# Patient Record
Sex: Female | Born: 1995 | Race: Black or African American | Hispanic: No | Marital: Single | Smoking: Never smoker
Health system: Southern US, Community
[De-identification: ages and names within clinical notes are randomized; demographics above are authoritative.]

## PROBLEM LIST (undated history)

## (undated) DIAGNOSIS — B353 Tinea pedis: Secondary | ICD-10-CM

## (undated) DIAGNOSIS — L905 Scar conditions and fibrosis of skin: Secondary | ICD-10-CM

## (undated) DIAGNOSIS — N644 Mastodynia: Secondary | ICD-10-CM

## (undated) DIAGNOSIS — R87612 Low grade squamous intraepithelial lesion on cytologic smear of cervix (LGSIL): Secondary | ICD-10-CM

## (undated) DIAGNOSIS — H547 Unspecified visual loss: Secondary | ICD-10-CM

## (undated) DIAGNOSIS — Z8619 Personal history of other infectious and parasitic diseases: Secondary | ICD-10-CM

## (undated) DIAGNOSIS — L708 Other acne: Secondary | ICD-10-CM

## (undated) HISTORY — DX: Personal history of other infectious and parasitic diseases: Z86.19

## (undated) HISTORY — DX: Low grade squamous intraepithelial lesion on cytologic smear of cervix (LGSIL): R87.612

---
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
## 2013-01-18 IMAGING — CR DG FINGER MIDDLE 2+V*R*
3 series · 3 of 3 positions shown · non-contrast
Comparison: None.

CLINICAL DATA: Pain and swelling to the third digit of the right
hand.  No trauma.

RIGHT MIDDLE FINGER 2+V

[x finger pa right]
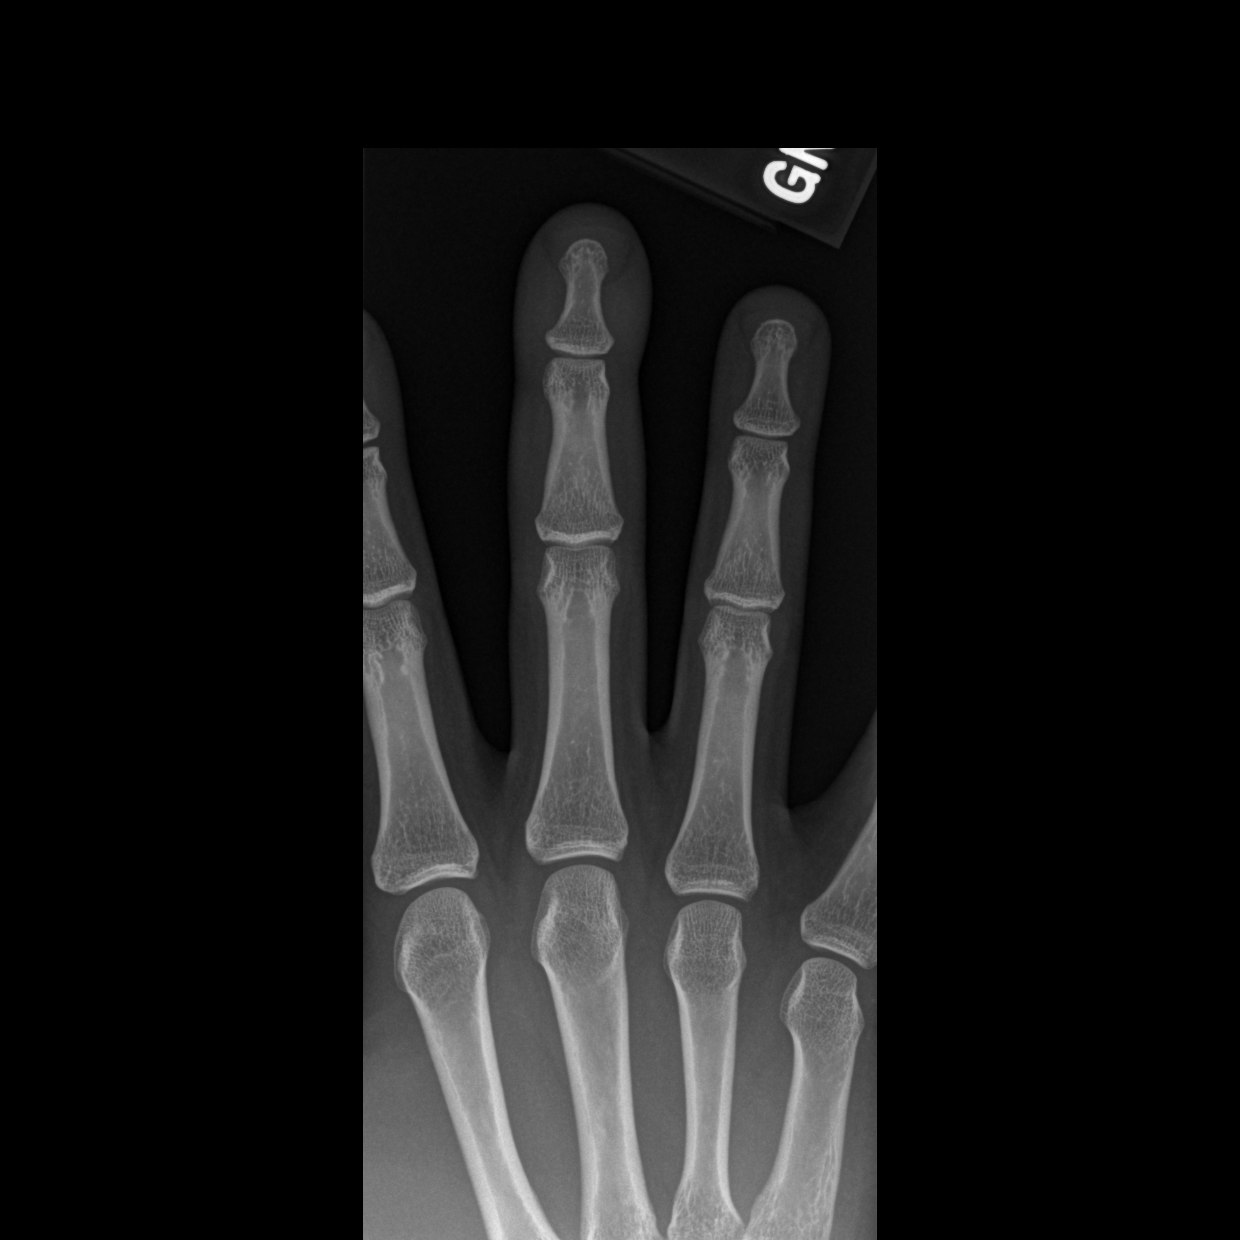

[x finger obl. right]
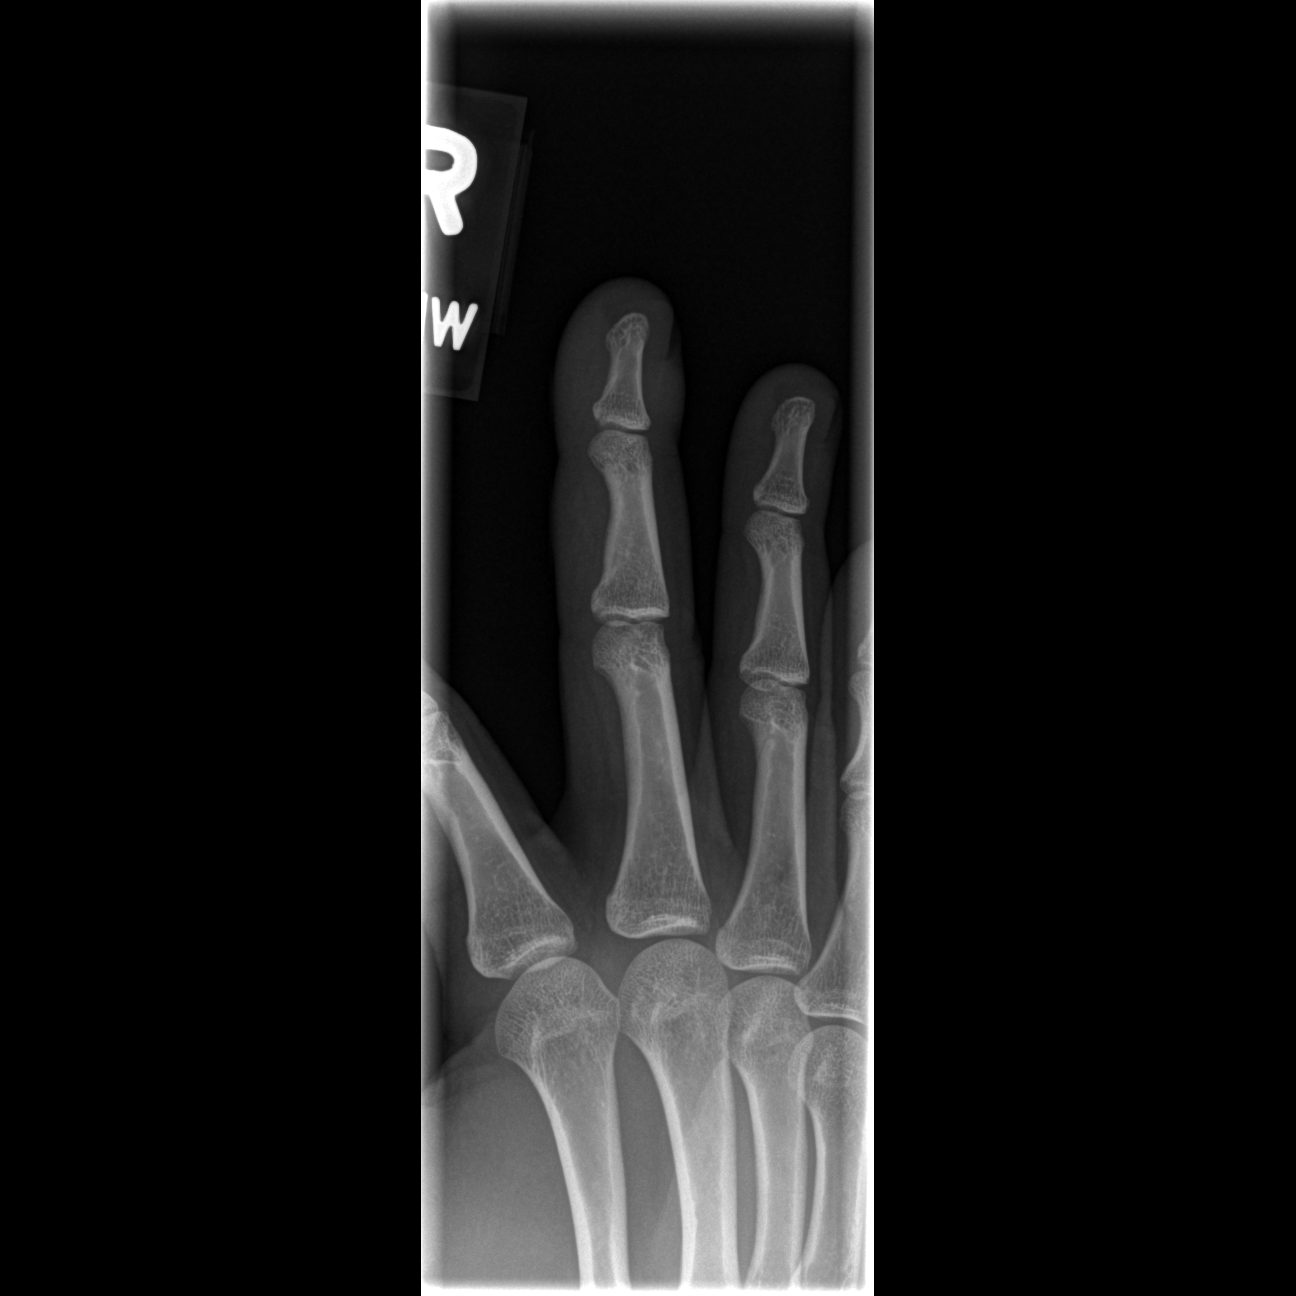

[x finger lateral right]
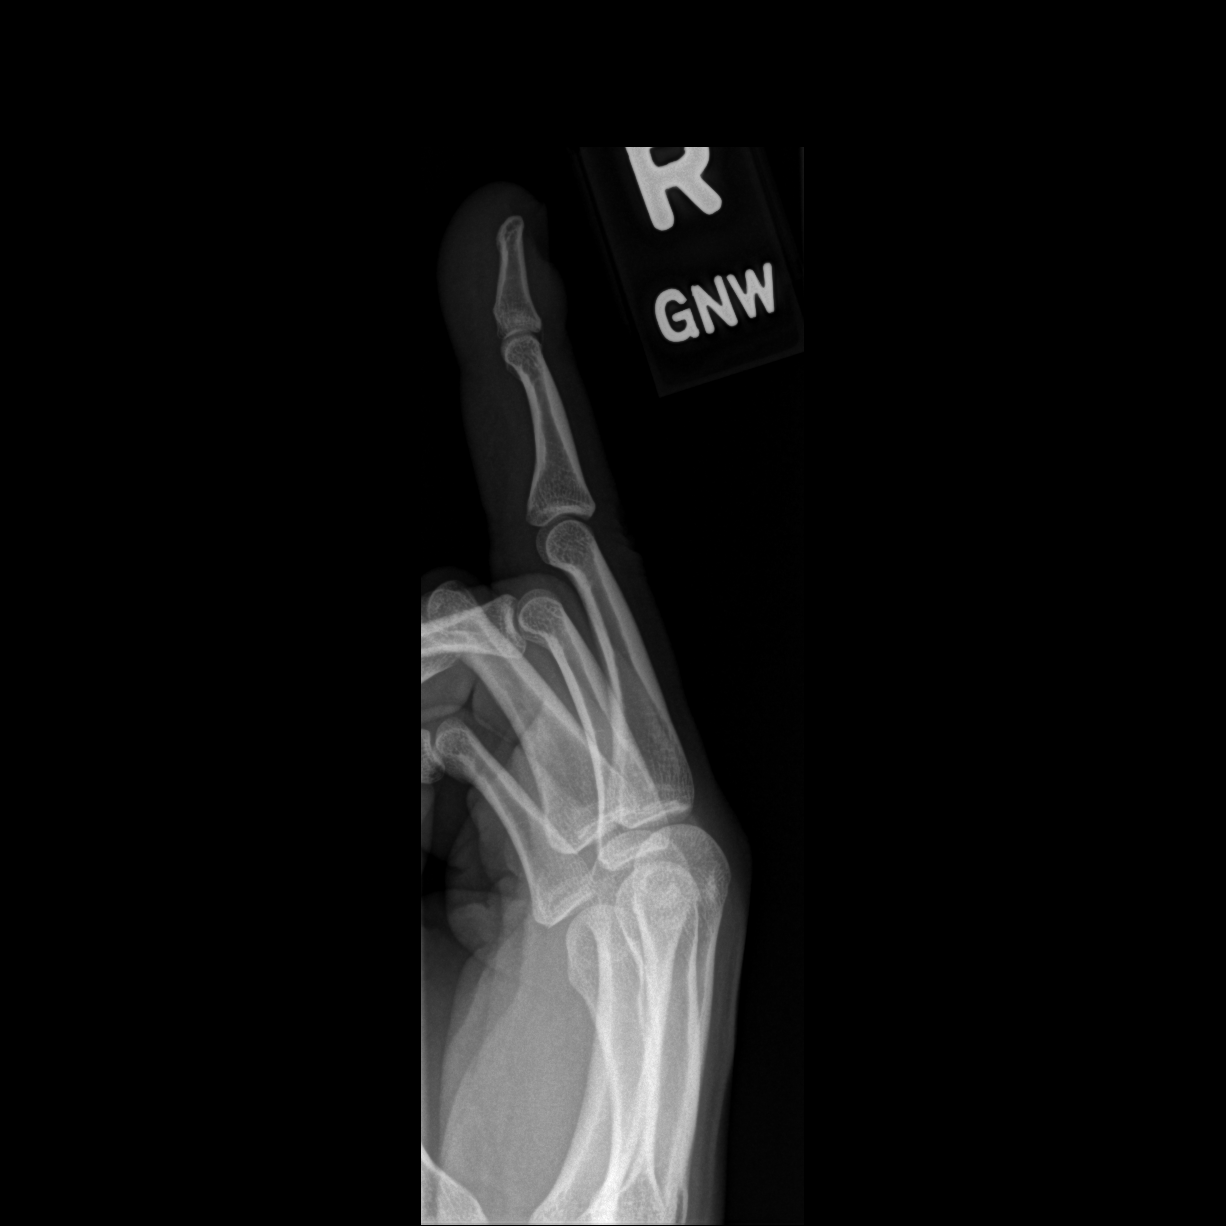

[3 of 3 positions shown; findings below may reference images not displayed]

FINDINGS: Soft tissue swelling over the distal aspect of the right
third finger.  Bones appear intact.  No evidence of acute fracture
or subluxation.  No focal bone lesion or bone destruction.  Bone
cortex and trabecular architecture appear intact.  No abnormal
periosteal reaction.  No radiopaque foreign bodies.
IMPRESSION: Distal soft tissue swelling over the right third finger.  No acute
bony abnormalities.  No soft tissue foreign bodies demonstrated.

## 2013-02-04 ENCOUNTER — Encounter: Payer: Self-pay | Admitting: Family Medicine

## 2013-02-04 ENCOUNTER — Ambulatory Visit (INDEPENDENT_AMBULATORY_CARE_PROVIDER_SITE_OTHER): Payer: Medicaid Other | Admitting: Family Medicine

## 2013-02-04 VITALS — BP 107/58 | HR 72 | Ht 65.5 in | Wt 156.3 lb

## 2013-02-04 DIAGNOSIS — J45990 Exercise induced bronchospasm: Secondary | ICD-10-CM

## 2013-02-04 DIAGNOSIS — Z00129 Encounter for routine child health examination without abnormal findings: Secondary | ICD-10-CM

## 2013-02-04 MED ORDER — ALBUTEROL SULFATE HFA 108 (90 BASE) MCG/ACT IN AERS: INHALATION_SPRAY | RESPIRATORY_TRACT | Status: DC

## 2013-02-04 NOTE — Progress Notes (Signed)
  Subjective:     History was provided by the mother.  Kathleen Alexander is a 17 y.o. female who is here for this wellness visit.   Current Issues: Current concerns include: Increased shortness of breath, especially with exercise activities.  Has noticed mild wheezing. Does not notice at other times.  Does not wake with cough or shortness of breath.   H (Home) Family Relationships: good Communication: good with parents Responsibilities: has responsibilities at home  E (Education): Grades: Bs and Cs School: good attendance Future Plans: college and unsure  A (Activities) Sports: no sports Exercise: Yes  and dance Activities: dance Friends: Yes   A (Auton/Safety) Auto: wears seat belt Bike: does not ride Safety: cannot swim  D (Diet) Diet: poor diet habits Risky eating habits: none Intake: no vegetables Body Image: positive body image  Drugs Tobacco: No Alcohol: No Drugs: No  Sex Activity: abstinent  Suicide Risk Emotions: healthy Depression: denies feelings of depression Suicidal: denies suicidal ideation     Objective:     Filed Vitals:   02/04/13 1046  BP: 107/58  Pulse: 72  Height: 5' 5.5" (1.664 m)  Weight: 156 lb 4.8 oz (70.897 kg)   Growth parameters are noted and are appropriate for age.  General:   alert and no distress  Gait:   normal  Skin:   normal  Oral cavity:   lips, mucosa, and tongue normal; teeth and gums normal  Eyes:   sclerae white, pupils equal and reactive, red reflex normal bilaterally  Ears:   normal bilaterally  Neck:   normal  Lungs:  clear to auscultation bilaterally  Heart:   regular rate and rhythm, S1, S2 normal, no murmur, click, rub or gallop  Abdomen:  soft, non-tender; bowel sounds normal; no masses,  no organomegaly  GU:  not examined  Extremities:   extremities normal, atraumatic, no cyanosis or edema  Neuro:  normal without focal findings, mental status, speech normal, alert and oriented x3, PERLA and  reflexes normal and symmetric     Assessment:    Healthy 17 y.o. female child.    Plan:   1. Anticipatory guidance discussed. Nutrition, Physical activity, Behavior, Emergency Care, Sick Care, Safety and Handout given Poor dietary habits, advised to incorporat more fruits and vegetables into diet and eat a well balanced diet.   2. Bronchospasm:  Sounds like exercised induced bronchospasm, will send in albuterol for her to try to see if this improves her symptoms.   3. Follow-up visit in 12 months for next wellness visit, or sooner as needed.

## 2013-02-04 NOTE — Patient Instructions (Signed)
Health Maintenance, 18- to 19-Year-Old SCHOOL PERFORMANCE After high school completion, the young adult may be attending college, technical or vocational school, or entering the military or the work force. SOCIAL AND EMOTIONAL DEVELOPMENT The young adult establishes adult relationships and explores sexual identity. Young adults may be living at home or in a college dorm or apartment. Increasing independence is important with young adults. Throughout adolescence, teens should assume responsibility of their own health care. IMMUNIZATIONS Most young adults should be fully vaccinated. A booster dose of Tdap (tetanus, diphtheria, and pertussis, or "whooping cough"), a dose of meningococcal vaccine to protect against a certain type of bacterial meningitis, hepatitis A, human papillomarvirus (HPV), chickenpox, or measles vaccines may be indicated, if not given at an earlier age. Annual influenza or "flu" vaccination should be considered during flu season.   TESTING Annual screening for vision and hearing problems is recommended. Vision should be screened objectively at least once between 18 and 19 years of age. The young adult may be screened for anemia or tuberculosis. Young adults should have a blood test to check for high cholesterol during this time period. Young adults should be screened for use of alcohol and drugs. If the young adult is sexually active, screening for sexually transmitted infections, pregnancy, or HIV may be performed. Screening for cervical cancer should be performed within 3 years of beginning sexual activity. NUTRITION AND ORAL HEALTH  Adequate calcium intake is important. Consume 3 servings of low-fat milk and dairy products daily. For those who do not drink milk or consume dairy products, calcium enriched foods, such as juice, bread, or cereal, dark, leafy greens, or canned fish are alternate sources of calcium.   Drink plenty of water. Limit fruit juice to 8 to 12 ounces per day.  Avoid sugary beverages or sodas.   Discourage skipping meals, especially breakfast. Teens should eat a good variety of vegetables and fruits, as well as lean meats.   Avoid high fat, high salt, and high sugar foods, such as candy, chips, and cookies.   Encourage young adults to participate in meal planning and preparation.   Eat meals together as a family whenever possible. Encourage conversation at mealtime.   Limit fast food choices and eating out at restaurants.   Brush teeth twice a day and floss.   Schedule dental exams twice a year.  SLEEP Regular sleep habits are important. PHYSICAL, SOCIAL, AND EMOTIONAL DEVELOPMENT  One hour of regular physical activity daily is recommended. Continue to participate in sports.   Encourage young adults to develop their own interests and consider community service or volunteerism.   Provide guidance to the young adult in making decisions about college and work plans.   Make sure that young adults know that they should never be in a situation that makes them uncomfortable, and they should tell partners if they do not want to engage in sexual activity.   Talk to the young adult about body image. Eating disorders may be noted at this time. Young adults may also be concerned about being overweight. Monitor the young adult for weight gain or loss.   Mood disturbances, depression, anxiety, alcoholism, or attention problems may be noted in young adults. Talk to the caregiver if there are concerns about mental illness.   Negotiate limit setting and independent decision making.   Encourage the young adult to handle conflict without physical violence.   Avoid loud noises which may impair hearing.   Limit television and computer time to 2 hours per   day. Individuals who engage in excessive sedentary activity are more likely to become overweight.  RISK BEHAVIORS  Sexually active young adults need to take precautions against pregnancy and sexually  transmitted infections. Talk to young adults about contraception.   Provide a tobacco-free and drug-free environment for the young adult. Talk to the young adult about drug, tobacco, and alcohol use among friends or at friends' homes. Make sure the young adult knows that smoking tobacco or marijuana and taking drugs have health consequences and may impact brain development.   Teach the young adult about appropriate use of over-the-counter or prescription medicines.   Establish guidelines for driving and for riding with friends.   Talk to young adults about the risks of drinking and driving or boating. Encourage the young adult to call you if he or she or friends have been drinking or using drugs.   Remind young adults to wear seat belts at all times in cars and life vests in boats.   Young adults should always wear a properly fitted helmet when they are riding a bicycle.   Use caution with all-terrain vehicles (ATVs) or other motorized vehicles.   Do not keep handguns in the home. (If you do, the gun and ammunition should be locked separately and out of the young adult's access.)   Equip your home with smoke detectors and change the batteries regularly. Make sure all family members know the fire escape plans for your home.   Teach young adults not to swim alone and not to dive in shallow water.   All individuals should wear sunscreen that protects against UVA and UVB light with at least a sun protection factor (SPF) of 30 when out in the sun. This minimizes sun burning.  WHAT'S NEXT? Young adults should visit their pediatrician or family physician yearly. By young adulthood, health care should be transitioned to a family physician or internal medicine specialist. Sexually active females may want to begin annual physical exams with a gynecologist. Document Released: 03/09/2007 Document Revised: 03/05/2012 Document Reviewed: 03/29/2007 [employer] Patient Information 2013 [employer].    

## 2013-05-16 ENCOUNTER — Other Ambulatory Visit: Payer: Self-pay | Admitting: Family Medicine

## 2013-08-08 ENCOUNTER — Telehealth: Payer: Self-pay | Admitting: Family Medicine

## 2013-08-08 NOTE — Telephone Encounter (Signed)
Mother called wanting a referral for her daughter glasses. She said we gave a referral over the phone a long time ago but forgot who that was. Kathleen Alexander

## 2013-08-09 NOTE — Telephone Encounter (Signed)
Called pt and asked her who she has seen in the past.  Spoke with Kathleen Alexander and Medicaid might not cover a routine eye exam at this age.  It also does not cover glasses.  Left a message for her to call back and let us know.  Jazmin Hartsell,CMA

## 2013-11-06 ENCOUNTER — Telehealth: Payer: Self-pay

## 2013-11-06 NOTE — Telephone Encounter (Signed)
Patient is needing to know the name of the eye doctor she was referred to earlier this year. Please call patient back.

## 2013-11-07 NOTE — Telephone Encounter (Signed)
Left pt a detailed message on identified VM.  Explained to pt that I was unable to find the name of an eye doctor mentioned in her last few visits.  Saw where this was mentioned over the summer and no referral was done then due to her age.  Advised her that she can see who ever she wants, but medicaid will not cover it unless there is a problem. Isauro Skelley,CMA

## 2013-12-09 ENCOUNTER — Ambulatory Visit: Payer: Medicaid Other

## 2013-12-18 ENCOUNTER — Ambulatory Visit: Payer: Medicaid Other | Admitting: Family Medicine

## 2013-12-21 ENCOUNTER — Other Ambulatory Visit (HOSPITAL_COMMUNITY)
Admission: RE | Admit: 2013-12-21 | Discharge: 2013-12-21 | Disposition: A | Discharge status: Home / Self Care | Payer: Medicaid Other | Source: Ambulatory Visit | Attending: Emergency Medicine | Admitting: Emergency Medicine

## 2013-12-21 ENCOUNTER — Encounter (HOSPITAL_COMMUNITY): Payer: Self-pay | Admitting: Emergency Medicine

## 2013-12-21 ENCOUNTER — Emergency Department (INDEPENDENT_AMBULATORY_CARE_PROVIDER_SITE_OTHER)
Admission: EM | Admit: 2013-12-21 | Discharge: 2013-12-21 | Disposition: A | Discharge status: Home / Self Care | Payer: Medicaid Other | Source: Home / Self Care | Attending: Emergency Medicine | Admitting: Emergency Medicine

## 2013-12-21 DIAGNOSIS — L309 Dermatitis, unspecified: Secondary | ICD-10-CM

## 2013-12-21 DIAGNOSIS — J45909 Unspecified asthma, uncomplicated: Secondary | ICD-10-CM

## 2013-12-21 DIAGNOSIS — L259 Unspecified contact dermatitis, unspecified cause: Secondary | ICD-10-CM

## 2013-12-21 DIAGNOSIS — N760 Acute vaginitis: Secondary | ICD-10-CM

## 2013-12-21 DIAGNOSIS — B9689 Other specified bacterial agents as the cause of diseases classified elsewhere: Secondary | ICD-10-CM

## 2013-12-21 DIAGNOSIS — Z113 Encounter for screening for infections with a predominantly sexual mode of transmission: Secondary | ICD-10-CM | POA: Insufficient documentation

## 2013-12-21 DIAGNOSIS — A499 Bacterial infection, unspecified: Secondary | ICD-10-CM

## 2013-12-21 LAB — POCT URINALYSIS DIP (DEVICE)
Bilirubin Urine: NEGATIVE
Hgb urine dipstick: NEGATIVE
Nitrite: NEGATIVE
pH: 6 (ref 5.0–8.0)

## 2013-12-21 MED ORDER — METRONIDAZOLE 500 MG PO TABS: 500.0000 mg | ORAL_TABLET | Freq: Two times a day (BID) | ORAL | Status: DC

## 2013-12-21 MED ORDER — BECLOMETHASONE DIPROPIONATE 80 MCG/ACT IN AERS: 2.0000 | INHALATION_SPRAY | Freq: Two times a day (BID) | RESPIRATORY_TRACT | Status: DC

## 2013-12-21 MED ORDER — TRIAMCINOLONE ACETONIDE 0.1 % EX CREA: 1.0000 "application " | TOPICAL_CREAM | Freq: Three times a day (TID) | CUTANEOUS | Status: DC

## 2013-12-21 MED ORDER — ALBUTEROL SULFATE HFA 108 (90 BASE) MCG/ACT IN AERS: 2.0000 | INHALATION_SPRAY | RESPIRATORY_TRACT | Status: DC | PRN

## 2013-12-21 NOTE — ED Provider Notes (Signed)
Chief Complaint:   Chief Complaint  Patient presents with  . Vaginal Discharge    History of Present Illness:   Kathleen Alexander is a 17 year old female who presents today for vaginal discharge, skin rash, and breathing problems.  1. Vaginal discharge: This is been going on for about a month. The discharge is white and malodorous. She denies any vaginal itching or pain. She does have mild lower abdominal pain rated at 2/10 in intensity. The patient's last menstrual period was December 3. The patient denies sexual activity in the past several years. Her menses have been regular.  2. Rash: She has a pruritic rash on her neck and behind her right ear for the past 5 months. She has not been putting anything on it. She does not recall any specific exposure to antigens or allergens. She denies any history of eczema.  3. Breathing problems: This is been going on for about a year. She sometimes feels tightness in her chest and difficulty breathing. This comes and goes, and is worse after she runs or if she smokes a cigarette. She was given an albuterol inhaler by her primary care physician, and this helped for a while, but its effectiveness seems to be waning. She uses the inhaler almost every day, although she states she's been out of it for a month and has been able to get by without it all. She has no history of asthma, and has never had pulmonary function testing.  Review of Systems:  Other than noted above, the patient denies any of the following symptoms: Systemic:  No fever, chills, sweats, or weight loss. GI:  No abdominal pain, nausea, anorexia, vomiting, diarrhea, constipation, melena or hematochezia. GU:  No dysuria, frequency, urgency, hematuria, vaginal discharge, itching, or abnormal vaginal bleeding. Skin:  No rash or itching.  PMFSH:  Past medical history, family history, social history, meds, and allergies were reviewed.   Physical Exam:   Vital signs:  BP 113/66  Pulse 81   Temp(Src) 98.7 F (37.1 C) (Oral)  Resp 18  SpO2 100% General:  Alert, oriented and in no distress. Lungs:  Breath sounds clear and equal bilaterally.  No wheezes, rales or rhonchi. Heart:  Regular rhythm.  No gallops or murmers. Abdomen:  Soft, flat and non-distended.  No organomegaly or mass.  No tenderness, guarding or rebound.  Bowel sounds normally active. Pelvic exam:  Normal external genitalia, vaginal and cervical mucosa were normal. There was a small amount of white, malodorous discharge. Cervix appeared normal. No pain on cervical motion. Uterus was anterior normal in size and shape. No adnexal masses or tenderness. DNA probes for gonorrhea, Chlamydia, Trichomonas, Gardnerella, Candida were obtained. Skin:  She has a dry, scaly, eczematous rash on her neck and behind her right ear.  Labs:   Results for orders placed during the hospital encounter of 12/21/13  POCT URINALYSIS DIP (DEVICE)      Result Value Range   Glucose, UA NEGATIVE  NEGATIVE mg/dL   Bilirubin Urine NEGATIVE  NEGATIVE   Ketones, ur NEGATIVE  NEGATIVE mg/dL   Specific Gravity, Urine >=1.030  1.005 - 1.030   Hgb urine dipstick NEGATIVE  NEGATIVE   pH 6.0  5.0 - 8.0   Protein, ur NEGATIVE  NEGATIVE mg/dL   Urobilinogen, UA 2.0 (*) 0.0 - 1.0 mg/dL   Nitrite NEGATIVE  NEGATIVE   Leukocytes, UA NEGATIVE  NEGATIVE  POCT PREGNANCY, URINE      Result Value Range   Preg Test, Ur  NEGATIVE  NEGATIVE    Assessment:  The primary encounter diagnosis was Bacterial vaginosis. Diagnoses of Asthma and Eczema were also pertinent to this visit.  I think she has asthma but needs pulmonary function testing. I have urged her to followup with her primary care Dr. The rash in the neck is most likely eczema, given that she probably has asthma. The vaginal discharge is most likely bacterial vaginosis. Awaiting results of DNA probes.  Plan:   1.  Meds:  The following meds were prescribed:   Discharge Medication List as of 12/21/2013   5:31 PM    START taking these medications   Details  !! albuterol (PROVENTIL HFA;VENTOLIN HFA) 108 (90 BASE) MCG/ACT inhaler Inhale 2 puffs into the lungs every 4 (four) hours as needed for wheezing or shortness of breath., Starting 12/21/2013, Until Discontinued, Normal    beclomethasone (QVAR) 80 MCG/ACT inhaler Inhale 2 puffs into the lungs 2 (two) times daily., Starting 12/21/2013, Until Discontinued, Normal    metroNIDAZOLE (FLAGYL) 500 MG tablet Take 1 tablet (500 mg total) by mouth 2 (two) times daily., Starting 12/21/2013, Until Discontinued, Normal    triamcinolone cream (KENALOG) 0.1 % Apply 1 application topically 3 (three) times daily., Starting 12/21/2013, Until Discontinued, Normal     !! - Potential duplicate medications found. Please discuss with provider.      2.  Patient Education/Counseling:  The patient was given appropriate handouts, self care instructions, and instructed in symptomatic relief.  Instructed to use her controller inhaler twice daily and a regular basis and use her Proventil inhaler as needed. She was sized to followup with her primary care physician.  3.  Follow up:  The patient was told to follow up if no better in 3 to 4 days, if becoming worse in any way, and given some red flag symptoms such as increasing difficulty breathing, worsening pelvic pain or fever which would prompt immediate return.  Follow up with Dr. Tawanna Cooler McDiarmid in 2 weeks.     Reuben Likes, MD 12/21/13 2129

## 2013-12-21 NOTE — ED Notes (Signed)
Multiple complaints: reports vaginal discharge reported as yellow/white discharge, noted for a month, low abdominal pain.  Denies pain with urination.   Reports rash around ears, neck and denies any new hair care products, moderate itching. Denies any diagnosi, reports having had an inhaler in the past and wants another one.  Denies any recent illness

## 2013-12-23 ENCOUNTER — Telehealth (HOSPITAL_COMMUNITY): Payer: Self-pay | Admitting: Emergency Medicine

## 2013-12-23 ENCOUNTER — Telehealth (HOSPITAL_COMMUNITY): Payer: Self-pay | Admitting: *Deleted

## 2013-12-23 MED ORDER — AZITHROMYCIN 500 MG PO TABS: ORAL_TABLET | ORAL | Status: DC

## 2013-12-23 NOTE — ED Notes (Signed)
I called pt.'s number and it was not in service. I called mother's number and mother wanted me to tell her the information. I told her that I had to give it to her daughter because she is 17 yo.  She put her on the phone.  Pt. verified x 2 and given results. Pt. told she needs Zithromax for Chlamydia and to finish all of the Flagyl for bacterial vaginosis.  Pt. told where to pick up her Rx.  Pt. instructed to notify her partner, no sex for 1 week and to practice safe sex. Pt. told she can get HIV testing at the [facility]. STD clinic, by appointment.  Pt. voiced understanding. Vassie Moselle 12/23/2013

## 2013-12-23 NOTE — ED Notes (Signed)
The patient's DNA probe came back positive for Chlamydia and Gardnerella. She was treated with Flagyl. She will need treatment with azithromycin 500 mg, #2 tablets, take both at one time. Prescription will be sent into the [facility] pharmacy on [address]. We will advise the patient to inform her sex partners, and report this to the health department.  Reuben Likes, MD 12/23/13 1336

## 2013-12-25 NOTE — Telephone Encounter (Signed)
Message copied by Reuben Likes on Wed Dec 25, 2013  7:47 AM ------      Message from: Vassie Moselle      Created: Tue Dec 24, 2013 11:19 PM       Pt. notified 12/29, documented.  DHHS form sent 12/30.      12/24/2013            ----- Message -----         From: Reuben Likes, MD         Sent: 12/23/2013   1:36 PM           To: Desiree Lucy York, RN            The patient's DNA probe came back positive for Chlamydia and Gardnerella. She was treated with Flagyl. She will need treatment with azithromycin 500 mg, #2 tablets, take both at one time. Prescription will be sent into the [facility] pharmacy on [address]. We will advise the patient to inform her sex partners, and report this to the health department.            ----- Message -----         From: [facility]         Sent: 12/23/2013  12:35 PM           To: Reuben Likes, MD                         ------

## 2013-12-30 ENCOUNTER — Telehealth: Payer: Self-pay | Admitting: Family Medicine

## 2013-12-30 NOTE — Telephone Encounter (Signed)
Mother called for her daughter and they need a referral for a eye exam. They have an appointment already set up for the end of the month with Dr. Katy Fitch. Can we send the referral to them and also call when it is approved. jw

## 2013-12-31 NOTE — Telephone Encounter (Signed)
LMOVM for Kathleen Alexander to call back.  At her last [facility] in February her vision was 20/20.  Why does she have an appt with an eye MD.  Dr. McDiarmid will need to know the details before making referral.  Kathleen Alexander, Kathleen Alexander

## 2014-01-01 ENCOUNTER — Other Ambulatory Visit (HOSPITAL_COMMUNITY)
Admission: RE | Admit: 2014-01-01 | Discharge: 2014-01-01 | Disposition: A | Discharge status: Home / Self Care | Payer: Medicaid Other | Source: Ambulatory Visit | Attending: Family Medicine | Admitting: Family Medicine

## 2014-01-01 ENCOUNTER — Ambulatory Visit: Payer: Medicaid Other | Admitting: Family Medicine

## 2014-01-01 ENCOUNTER — Encounter (HOSPITAL_COMMUNITY): Payer: Self-pay | Admitting: Emergency Medicine

## 2014-01-01 ENCOUNTER — Emergency Department (INDEPENDENT_AMBULATORY_CARE_PROVIDER_SITE_OTHER)
Admission: EM | Admit: 2014-01-01 | Discharge: 2014-01-01 | Disposition: A | Discharge status: Home / Self Care | Payer: Medicaid Other | Source: Home / Self Care | Attending: Family Medicine | Admitting: Family Medicine

## 2014-01-01 DIAGNOSIS — B373 Candidiasis of vulva and vagina: Secondary | ICD-10-CM

## 2014-01-01 DIAGNOSIS — Z113 Encounter for screening for infections with a predominantly sexual mode of transmission: Secondary | ICD-10-CM | POA: Insufficient documentation

## 2014-01-01 DIAGNOSIS — B3731 Acute candidiasis of vulva and vagina: Secondary | ICD-10-CM

## 2014-01-01 DIAGNOSIS — N760 Acute vaginitis: Secondary | ICD-10-CM | POA: Insufficient documentation

## 2014-01-01 LAB — POCT URINALYSIS DIP (DEVICE)
GLUCOSE, UA: 100 mg/dL — AB
HGB URINE DIPSTICK: NEGATIVE
Ketones, ur: NEGATIVE mg/dL
Nitrite: NEGATIVE
PROTEIN: 30 mg/dL — AB
Specific Gravity, Urine: >=1.03 (ref 1.005–1.030)
UROBILINOGEN UA: 2 mg/dL — AB (ref 0.0–1.0)
pH: 6 (ref 5.0–8.0)

## 2014-01-01 LAB — POCT PREGNANCY, URINE: Preg Test, Ur: NEGATIVE

## 2014-01-01 MED ORDER — FLUCONAZOLE 150 MG PO TABS: 150.0000 mg | ORAL_TABLET | Freq: Once | ORAL | Status: DC

## 2014-01-01 NOTE — Discharge Instructions (Signed)
Thank you for coming in today. Take fluconazole once. You may repeat this in one week as needed. Followup with your primary care Dr. if not getting better. I will call you if any of her lab tests are abnormal and the different treatment.  Candidal Vulvovaginitis Candidal vulvovaginitis is an infection of the vagina and vulva. The vulva is the skin around the opening of the vagina. This may cause itching and discomfort in and around the vagina.  HOME CARE  Only take medicine as told by your doctor.  Do not have sex (intercourse) until the infection is healed or as told by your doctor.  Practice safe sex.  Tell your sex partner about your infection.  Do not douche or use tampons.  Wear cotton underwear. Do not wear tight pants or panty hose.  Eat yogurt. This may help treat and prevent yeast infections. GET HELP RIGHT AWAY IF:   You have a fever.  Your problems get worse during treatment or do not get better in 3 days.  You have discomfort, irritation, or itching in your vagina or vulva area.  You have pain after sex.  You start to get belly (abdominal) pain. MAKE SURE YOU:  Understand these instructions.  Will watch your condition.  Will get help right away if you are not doing well or get worse. Document Released: 03/10/2009 Document Revised: 03/05/2012 Document Reviewed: 03/10/2009 [facility] Patient Information 2014 [place], [place].

## 2014-01-01 NOTE — ED Provider Notes (Signed)
Kathleen Alexander is a 18 y.o. female who presents to Urgent Care today for vaginal itching starting yesterday. Patient has not tried any medications yet.  Patient was seen on December 27 and treated for both bacterial vaginosis and Chlamydia with metronidazole and azithromycin. She felt better until yesterday. She denies abdominal pain nausea vomiting fevers or chills. She feels well otherwise.  Patient declines a speculum exam today wishing for blind swabbing  History reviewed. No pertinent past medical history. History  Substance Use Topics  . Smoking status: Never Smoker   . Smokeless tobacco: Not on file  . Alcohol Use: No   ROS as above Medications reviewed. No current facility-administered medications for this encounter.   Current Outpatient Prescriptions  Medication Sig Dispense Refill  . albuterol (PROVENTIL HFA;VENTOLIN HFA) 108 (90 BASE) MCG/ACT inhaler Inhale 2 puffs into the lungs every 4 (four) hours as needed for wheezing or shortness of breath.  1 Inhaler  3  . beclomethasone (QVAR) 80 MCG/ACT inhaler Inhale 2 puffs into the lungs 2 (two) times daily.  1 Inhaler  3  . fluconazole (DIFLUCAN) 150 MG tablet Take 1 tablet (150 mg total) by mouth once.  1 tablet  3  . PROVENTIL HFA 108 (90 BASE) MCG/ACT inhaler USE 2 PUFFS 1/2 HOUR BEFORE ACTIVITY  6.7 each  0  . triamcinolone cream (KENALOG) 0.1 % Apply 1 application topically 3 (three) times daily.  85.2 g  3    Exam:  BP 100/67  Pulse 89  Temp(Src) 98.4 F (36.9 C) (Oral)  Resp 18  SpO2 100% Gen: Well NAD Exts: Non edematous BL  LE, warm and well perfused.  GYN: Normal appearing external genitalia. No significant discharge. Abdomen is nontender  Results for orders placed during the hospital encounter of 01/01/14 (from the past 24 hour(s))  POCT URINALYSIS DIP (DEVICE)     Status: Abnormal   Collection Time    01/01/14 10:32 AM      Result Value Range   Glucose, UA 100 (*) NEGATIVE mg/dL   Bilirubin Urine SMALL  (*) NEGATIVE   Ketones, ur NEGATIVE  NEGATIVE mg/dL   Specific Gravity, Urine >=1.030  1.005 - 1.030   Hgb urine dipstick NEGATIVE  NEGATIVE   pH 6.0  5.0 - 8.0   Protein, ur 30 (*) NEGATIVE mg/dL   Urobilinogen, UA 2.0 (*) 0.0 - 1.0 mg/dL   Nitrite NEGATIVE  NEGATIVE   Leukocytes, UA SMALL (*) NEGATIVE  POCT PREGNANCY, URINE     Status: None   Collection Time    01/01/14 10:33 AM      Result Value Range   Preg Test, Ur NEGATIVE  NEGATIVE   No results found.  Assessment and Plan: 18 y.o. female with vaginal yeast infection. Cytology pending Treat empirically with fluconazole. Followup with primary care provider.  Discussed warning signs or symptoms. Please see discharge instructions. Patient expresses understanding.    Gregor Hams, MD 01/01/14 [phone]

## 2014-01-01 NOTE — ED Notes (Signed)
C/o she has developed yeast after starting flagyl. Declines to provide UA at this time

## 2014-01-04 ENCOUNTER — Telehealth (HOSPITAL_COMMUNITY): Payer: Self-pay | Admitting: Family Medicine

## 2014-01-04 MED ORDER — DOXYCYCLINE HYCLATE 100 MG PO CAPS: 100.0000 mg | ORAL_CAPSULE | Freq: Two times a day (BID) | ORAL | Status: DC

## 2014-01-04 MED ORDER — METRONIDAZOLE 500 MG PO TABS: 500.0000 mg | ORAL_TABLET | Freq: Two times a day (BID) | ORAL | Status: DC

## 2014-01-04 NOTE — ED Notes (Signed)
Pt still has chlamydia. Failed azithromycin treatment. Patient states that she took the medicine.  Called in Doxy.  Asked pt to call me back.   Gregor Hams, MD 01/04/14 [phone]

## 2014-01-04 NOTE — ED Notes (Signed)
Also called in [place], MD 01/04/14 [phone]

## 2014-01-06 ENCOUNTER — Telehealth: Payer: Self-pay | Admitting: Home Health Services

## 2014-01-06 NOTE — Telephone Encounter (Signed)
Left message for patient to schedule ED follow up appointment with PCP.

## 2014-01-07 NOTE — ED Notes (Addendum)
GC neg., Chlamydia pos., Affirm: Candida and Trich neg., Gardnerella pos.  1/9 Message to Dr. Georgina Snell.  1/10 He did orders for Doxycycline and Flagyl and attempted to call pt. I called today and left a message to call. Call 2. Roselyn Meier 01/07/2014 I called pt. Pt. verified x 2 and given results.  Pt. told she needs Doxycycline for the Chlamydia and Flagyl for the bacterial vaginosis and where to pick up the Rx.'s. Pt. said the pharmacy called her on Sunday and she is already taking the medication. I told that Dr. Georgina Snell said the Zithromax did not completely clear the Chlamydia up and that is why she needs the full week of Doxycycline.  Pt. instructed to no alcohol while taking the Flagyl.   I asked if she had notified her partner and she said yes.  Pt. instructed no sex until she has finished her medication and to practiced safe sex. Pt. told she can get HIV testing at the [facility]. STD clinic, by appointment.  Pt. voiced understanding. 01/08/2014

## 2014-01-13 NOTE — ED Notes (Signed)
DHHS form faxed to the [facility]. Roselyn Meier 01/13/2014

## 2014-02-03 ENCOUNTER — Encounter: Payer: Self-pay | Admitting: Family Medicine

## 2014-02-03 ENCOUNTER — Ambulatory Visit (INDEPENDENT_AMBULATORY_CARE_PROVIDER_SITE_OTHER): Payer: Medicaid Other | Admitting: Family Medicine

## 2014-02-03 ENCOUNTER — Other Ambulatory Visit (HOSPITAL_COMMUNITY)
Admission: RE | Admit: 2014-02-03 | Discharge: 2014-02-03 | Disposition: A | Discharge status: Home / Self Care | Payer: Medicaid Other | Source: Ambulatory Visit | Attending: Family Medicine | Admitting: Family Medicine

## 2014-02-03 VITALS — BP 109/70 | HR 81 | Temp 98.2°F | Ht 65.0 in | Wt 160.0 lb

## 2014-02-03 DIAGNOSIS — N898 Other specified noninflammatory disorders of vagina: Secondary | ICD-10-CM

## 2014-02-03 DIAGNOSIS — Z113 Encounter for screening for infections with a predominantly sexual mode of transmission: Secondary | ICD-10-CM | POA: Insufficient documentation

## 2014-02-03 DIAGNOSIS — N760 Acute vaginitis: Secondary | ICD-10-CM

## 2014-02-03 LAB — POCT WET PREP (WET MOUNT): CLUE CELLS WET PREP WHIFF POC: NEGATIVE

## 2014-02-03 NOTE — Progress Notes (Signed)
Patient ID: Kathleen Alexander    DOB: Sep 07, 1996, 18 y.o.   MRN: [id number] --- Subjective:  Kathleen Alexander is a 18 y.o.female who presents with vaginal discharge. It has been present for two weeks. It is a white thick discharge with a malodorous odor. Otherwise, no burning, itching. No abdominal pain. No nausea, no vomiting.  She was diagnosed and treated for BV and trich in January of this year. Since then, she has had sexual intercourse but reports condom use. Partner was treated.   ROS: see HPI Past Medical History: reviewed and updated medications and allergies. Social History: Tobacco: none  Objective: Filed Vitals:   02/03/14 1545  BP: 109/70  Pulse: 81  Temp: 98.2 F (36.8 C)    Physical Examination:   General appearance - alert, well appearing, and in no distress Pelvic exam: normal external genitalia, vulva, vagina, cervix, uterus and adnexa. No cervical motion tenderness. Thick white discharge present

## 2014-02-03 NOTE — Patient Instructions (Signed)
We will call you with results  

## 2014-02-03 NOTE — Assessment & Plan Note (Addendum)
Wet prep, GC/Chl sent.  Recommended HIV/RPR testing but patient declined.

## 2014-02-06 ENCOUNTER — Telehealth: Payer: Self-pay | Admitting: Family Medicine

## 2014-02-06 DIAGNOSIS — N760 Acute vaginitis: Secondary | ICD-10-CM

## 2014-02-06 MED ORDER — FLUCONAZOLE 150 MG PO TABS: 150.0000 mg | ORAL_TABLET | Freq: Once | ORAL | Status: DC

## 2014-02-06 NOTE — Telephone Encounter (Signed)
Called pt.LMVM to call back. Please see message. Thanks. .Kathleen Alexander  

## 2014-02-06 NOTE — Telephone Encounter (Signed)
Please let patient know that her wet prep and GC/Chl were all normal.  She did have a lot of discharge on exam, suspicious for yeast. Will therefore treat empirically for yeast with fluconazole 150mg  x1.   Thank you!  Liam Graham, PGY-3 Family Medicine Resident

## 2014-02-07 NOTE — Telephone Encounter (Signed)
LMVM.  Lazaro Arms, CMA '

## 2014-02-12 NOTE — Telephone Encounter (Signed)
Attempted to call again.  No answer.  Ilene Witcher, Loralyn [place], [place]

## 2014-02-24 ENCOUNTER — Ambulatory Visit (INDEPENDENT_AMBULATORY_CARE_PROVIDER_SITE_OTHER): Payer: Medicaid Other | Admitting: Family Medicine

## 2014-02-24 VITALS — BP 111/75 | HR 90 | Temp 99.1°F | Wt 152.0 lb

## 2014-02-24 DIAGNOSIS — N760 Acute vaginitis: Secondary | ICD-10-CM

## 2014-02-24 DIAGNOSIS — N898 Other specified noninflammatory disorders of vagina: Secondary | ICD-10-CM

## 2014-02-24 MED ORDER — FLUCONAZOLE 150 MG PO TABS: 150.0000 mg | ORAL_TABLET | Freq: Once | ORAL | Status: DC

## 2014-02-24 NOTE — Patient Instructions (Signed)
Good to see you today!  Thanks for coming in.  Take the diflucan one tablet repeat in 4 days if not all back to normal  If after 2 dose you are still having symptoms then call us

## 2014-02-24 NOTE — Assessment & Plan Note (Signed)
Will have her use the diflucan.  If symptoms do not resolve may need further treatment with flagyl or re-exam

## 2014-02-24 NOTE — Progress Notes (Signed)
   Subjective:    Patient ID: Kathleen Alexander, female    DOB: [birth year], 18 y.o.   MRN: [id number]  HPI  Vaginal symptoms Had vaginal discharge and was treated in UC with antibiotics. Then saw Dr Otis Dials had lab tests - normal and was called in diflucan but did not get.  Still having some discharge and now odor.  Not sexually active.  LMP last month   Review of Systems     Objective:   Physical Exam        Assessment & Plan:

## 2014-03-20 ENCOUNTER — Ambulatory Visit (INDEPENDENT_AMBULATORY_CARE_PROVIDER_SITE_OTHER): Payer: Medicaid Other | Admitting: Family Medicine

## 2014-03-20 ENCOUNTER — Other Ambulatory Visit (HOSPITAL_COMMUNITY)
Admission: RE | Admit: 2014-03-20 | Discharge: 2014-03-20 | Disposition: A | Discharge status: Home / Self Care | Payer: Medicaid Other | Source: Ambulatory Visit | Attending: Family Medicine | Admitting: Family Medicine

## 2014-03-20 ENCOUNTER — Encounter: Payer: Self-pay | Admitting: Family Medicine

## 2014-03-20 VITALS — BP 100/69 | HR 87 | Ht 65.0 in | Wt 155.0 lb

## 2014-03-20 DIAGNOSIS — Z23 Encounter for immunization: Secondary | ICD-10-CM

## 2014-03-20 DIAGNOSIS — N760 Acute vaginitis: Secondary | ICD-10-CM | POA: Insufficient documentation

## 2014-03-20 DIAGNOSIS — Z113 Encounter for screening for infections with a predominantly sexual mode of transmission: Secondary | ICD-10-CM | POA: Insufficient documentation

## 2014-03-20 DIAGNOSIS — N898 Other specified noninflammatory disorders of vagina: Secondary | ICD-10-CM

## 2014-03-20 LAB — POCT WET PREP (WET MOUNT)
CLUE CELLS WET PREP WHIFF POC: POSITIVE
WBC, Wet Prep HPF POC: >20

## 2014-03-20 MED ORDER — METRONIDAZOLE 0.75 % VA GEL: 1.0000 | Freq: Two times a day (BID) | VAGINAL | Status: DC

## 2014-03-20 MED ORDER — METRONIDAZOLE 500 MG PO TABS: 500.0000 mg | ORAL_TABLET | Freq: Two times a day (BID) | ORAL | Status: DC

## 2014-03-20 NOTE — Progress Notes (Signed)
Patient ID: RANDYE TREICHLER    DOB: [birth year], 18 y.o.   MRN: [id number] --- Subjective:  Carey is a 18 y.o.female who presents with odorous discharge. Has been present for several weeks. Discharges brownish yellowish. It is associated with a bad odor. She denies any vaginal itching or burning. No dysuria. She was treated with Diflucan which did not help. Not sexually active since last year.   ROS: see HPI Past Medical History: reviewed and updated medications and allergies. Social History: Tobacco: None  Objective: Filed Vitals:   03/20/14 1700  BP: 100/69  Pulse: 87    Physical Examination:   General appearance - alert, well appearing, and in no distress Pelvic exam: normal external genitalia, vulva, vagina. cervix is erythematous, thin yellowish discharge with odor present, no cervical motion tenderness, uterus and adnexa normal  Wet prep: clue cells present

## 2014-03-20 NOTE — Assessment & Plan Note (Addendum)
Wet prep positive for BV. Treat with metrogel at patient's request. Will also send oral flagyl in case metrogel is too expensive.  Gc/chl pending

## 2014-03-21 ENCOUNTER — Telehealth: Payer: Self-pay | Admitting: Family Medicine

## 2014-03-21 LAB — CERVICOVAGINAL ANCILLARY ONLY
CHLAMYDIA, DNA PROBE: NEGATIVE
Neisseria Gonorrhea: NEGATIVE

## 2014-03-21 NOTE — Telephone Encounter (Signed)
Please let patient know that her GC/Chl results were normal.   Thank you!  Liam Graham, PGY-3 Family Medicine Resident

## 2014-03-21 NOTE — Telephone Encounter (Signed)
Pt notified.  [name], CMA  

## 2014-03-31 ENCOUNTER — Encounter: Payer: Self-pay | Admitting: Family Medicine

## 2014-03-31 ENCOUNTER — Ambulatory Visit (INDEPENDENT_AMBULATORY_CARE_PROVIDER_SITE_OTHER): Payer: Medicaid Other | Admitting: Family Medicine

## 2014-03-31 VITALS — BP 104/69 | HR 70 | Ht 65.0 in | Wt 152.0 lb

## 2014-03-31 DIAGNOSIS — A499 Bacterial infection, unspecified: Secondary | ICD-10-CM

## 2014-03-31 DIAGNOSIS — B9689 Other specified bacterial agents as the cause of diseases classified elsewhere: Secondary | ICD-10-CM

## 2014-03-31 DIAGNOSIS — N760 Acute vaginitis: Secondary | ICD-10-CM

## 2014-03-31 MED ORDER — METRONIDAZOLE 0.75 % VA GEL: 1.0000 | Freq: Two times a day (BID) | VAGINAL | Status: DC

## 2014-03-31 NOTE — Assessment & Plan Note (Signed)
Patient has recurrence of symptoms consistent with recent BV infection.  -Will repeat course of Metrogel, refill provided if symptoms persist

## 2014-03-31 NOTE — Patient Instructions (Signed)
Restart Metronidazole gel, complete 5 day course, may repeat if still having symptoms.

## 2014-03-31 NOTE — Progress Notes (Signed)
   Subjective:    Patient ID: Kathleen Alexander, female    DOB: July 16, 1996, 18 y.o.   MRN: [id number]  HPI 18 y/o female presents for evaluation of vaginal discharge. She was recently seen in office on 03/20/14 and diagnosed with bacterial vaginosis. She has a history of multiple BV infections. She was treated with Metrogel. Her symptoms improved however recurred after completing course. Now having grey discharge and vaginal irritation. No dysuria. No abdominal pain. No fevers/chills. Patient requests refill of Metrogel as she does not tolerate PO Metronidazole.   Social: denies recent intercourse, has been having regular periods   Review of Systems  Constitutional: Negative for fever, chills and fatigue.  Gastrointestinal: Negative for nausea, vomiting and diarrhea.  Genitourinary: Positive for vaginal discharge. Negative for dysuria and pelvic pain.       Objective:   Physical Exam Vitals: reviewed Gen: pleasant AAF, NAD GU: performed with nursing staff present, declines speculum exam, normal external female anatomy, copious grey discharge present, external vulva appears slightly irritated  Wet Mount 3/26 - clue cells, positive whiff, no yeast CG/Chlam culture negative from 3/26      Assessment & Plan:  Please see problem specific assessment and plan.

## 2014-05-27 ENCOUNTER — Ambulatory Visit: Payer: Medicaid Other

## 2014-05-28 ENCOUNTER — Ambulatory Visit: Payer: Medicaid Other | Admitting: Sports Medicine

## 2014-05-29 ENCOUNTER — Other Ambulatory Visit (HOSPITAL_COMMUNITY)
Admission: RE | Admit: 2014-05-29 | Discharge: 2014-05-29 | Disposition: A | Discharge status: Home / Self Care | Payer: Medicaid Other | Source: Ambulatory Visit | Attending: Family Medicine | Admitting: Family Medicine

## 2014-05-29 ENCOUNTER — Encounter: Payer: Self-pay | Admitting: Family Medicine

## 2014-05-29 ENCOUNTER — Telehealth: Payer: Self-pay | Admitting: *Deleted

## 2014-05-29 ENCOUNTER — Ambulatory Visit (INDEPENDENT_AMBULATORY_CARE_PROVIDER_SITE_OTHER): Payer: Medicaid Other | Admitting: Family Medicine

## 2014-05-29 VITALS — BP 106/54 | HR 75 | Temp 98.3°F | Ht 65.0 in | Wt 153.0 lb

## 2014-05-29 DIAGNOSIS — Z113 Encounter for screening for infections with a predominantly sexual mode of transmission: Secondary | ICD-10-CM | POA: Insufficient documentation

## 2014-05-29 DIAGNOSIS — N760 Acute vaginitis: Secondary | ICD-10-CM

## 2014-05-29 DIAGNOSIS — A499 Bacterial infection, unspecified: Secondary | ICD-10-CM

## 2014-05-29 DIAGNOSIS — B9689 Other specified bacterial agents as the cause of diseases classified elsewhere: Secondary | ICD-10-CM

## 2014-05-29 DIAGNOSIS — R300 Dysuria: Secondary | ICD-10-CM

## 2014-05-29 LAB — POCT URINALYSIS DIPSTICK
Bilirubin, UA: NEGATIVE
Glucose, UA: NEGATIVE
Ketones, UA: NEGATIVE
NITRITE UA: NEGATIVE
PH UA: 6.5
Protein, UA: 30
RBC UA: NEGATIVE
Spec Grav, UA: >=1.03
Urobilinogen, UA: 1

## 2014-05-29 LAB — POCT WET PREP (WET MOUNT): Clue Cells Wet Prep Whiff POC: POSITIVE

## 2014-05-29 LAB — POCT UA - MICROSCOPIC ONLY

## 2014-05-29 MED ORDER — CLINDAMYCIN HCL 300 MG PO CAPS: 300.0000 mg | ORAL_CAPSULE | Freq: Two times a day (BID) | ORAL | Status: DC

## 2014-05-29 NOTE — Progress Notes (Signed)
Patient ID: JAELEY WIKER    DOB: 02-Oct-1996, 18 y.o.   MRN: [id number] --- Subjective:  Danila is a 18 y.o.female who presents for evaluation of vaginal discharge.  Was treated for BV on 03/31/14. Since then, she has had recurrence of greyish vaginal discharge associated with vaginal itching and burning. She is sexually active, last intercourse was 2 weeks ago with condoms. No abdominal pain, no N/V.  She had an episode of dysuria 2 days ago but this has since resolved. No increased urinary frequency.   ROS: see HPI Past Medical History: reviewed and updated medications and allergies. Social History: Tobacco: none  Objective: Filed Vitals:   05/29/14 0957  BP: 106/54  Pulse: 75  Temp: 98.3 F (36.8 C)    Physical Examination:   General appearance - alert, well appearing, and in no distress Abdomen: soft, non tender, non distended, present bowel sounds Pelvic exam: normal external genitalia, vulva, vagina, cervix, uterus and adnexa. Discharge: white, greyish disharge

## 2014-05-29 NOTE — Progress Notes (Unsigned)
Pt has not had physical since 01/2013.  [name] will call and have them make appt. Kathleen Alexander

## 2014-05-29 NOTE — Telephone Encounter (Signed)
Left detailed message on patient voicemail,instructing her to schedule an annual exam,this would need to take place in order for the physician to complete forms.[place]

## 2014-05-29 NOTE — Patient Instructions (Signed)
-  I will call you with the results

## 2014-05-29 NOTE — Assessment & Plan Note (Signed)
UA unremarkable. Concentrated urine which could explain her symptoms of dysuria.  Increase water intake.

## 2014-05-29 NOTE — Progress Notes (Unsigned)
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  Please mail to her when completed.

## 2014-05-29 NOTE — Assessment & Plan Note (Signed)
Recurrent BV after treatment with flagyl oral and metrogel.  Will attempt trial of clindamycin.

## 2014-06-02 ENCOUNTER — Telehealth: Payer: Self-pay | Admitting: Family Medicine

## 2014-06-02 NOTE — Telephone Encounter (Signed)
Attempted to call patient but the number listed in the chart is incorrect. If she calls please let her know her labs were normal.Kathleen Alexander L Rosi Secrist

## 2014-06-02 NOTE — Telephone Encounter (Signed)
Please let patient know that GC/Chl was negative  Thank you!  Liam Graham, PGY-3 Family Medicine Resident

## 2014-10-02 ENCOUNTER — Ambulatory Visit: Payer: Medicaid Other | Admitting: Family Medicine

## 2014-10-03 ENCOUNTER — Ambulatory Visit (INDEPENDENT_AMBULATORY_CARE_PROVIDER_SITE_OTHER): Payer: Medicaid Other | Admitting: Family Medicine

## 2014-10-03 ENCOUNTER — Other Ambulatory Visit (HOSPITAL_COMMUNITY)
Admission: RE | Admit: 2014-10-03 | Discharge: 2014-10-03 | Disposition: A | Discharge status: Home / Self Care | Payer: Medicaid Other | Source: Ambulatory Visit | Attending: Family Medicine | Admitting: Family Medicine

## 2014-10-03 ENCOUNTER — Ambulatory Visit: Payer: Medicaid Other | Admitting: Family Medicine

## 2014-10-03 ENCOUNTER — Encounter: Payer: Self-pay | Admitting: Family Medicine

## 2014-10-03 VITALS — BP 110/80 | HR 76 | Temp 99.1°F | Ht 65.0 in | Wt 152.2 lb

## 2014-10-03 DIAGNOSIS — A499 Bacterial infection, unspecified: Secondary | ICD-10-CM

## 2014-10-03 DIAGNOSIS — N898 Other specified noninflammatory disorders of vagina: Secondary | ICD-10-CM

## 2014-10-03 DIAGNOSIS — N760 Acute vaginitis: Secondary | ICD-10-CM

## 2014-10-03 DIAGNOSIS — Z113 Encounter for screening for infections with a predominantly sexual mode of transmission: Secondary | ICD-10-CM | POA: Diagnosis not present

## 2014-10-03 DIAGNOSIS — B9689 Other specified bacterial agents as the cause of diseases classified elsewhere: Secondary | ICD-10-CM

## 2014-10-03 LAB — POCT WET PREP (WET MOUNT)
Clue Cells Wet Prep Whiff POC: POSITIVE
WBC, Wet Prep HPF POC: >20

## 2014-10-03 LAB — POCT UA - MICROSCOPIC ONLY

## 2014-10-03 LAB — POCT URINALYSIS DIPSTICK
Bilirubin, UA: NEGATIVE
Blood, UA: NEGATIVE
Glucose, UA: NEGATIVE
KETONES UA: NEGATIVE
Nitrite, UA: NEGATIVE
PROTEIN UA: NEGATIVE
SPEC GRAV UA: 1.02
Urobilinogen, UA: 0.2
pH, UA: 7

## 2014-10-03 LAB — POCT URINE PREGNANCY: Preg Test, Ur: NEGATIVE

## 2014-10-03 MED ORDER — METRONIDAZOLE 500 MG PO TABS
500.0000 mg | ORAL_TABLET | Freq: Three times a day (TID) | ORAL | Status: DC
Start: 2014-10-03 — End: 2015-02-20

## 2014-10-03 NOTE — Progress Notes (Signed)
Patient ID: Kathleen Alexander, female   DOB: 09/07/1996, 18 y.o.   MRN: [id number]    Subjective: [id number] complaints HPI: Patient is a 18 y.o. female presenting to clinic today for vaginal complaints/ possible BV. Concerns today include:  1. Vaginal discharge  Patient reports she has had white, smelly discharge from her vagina x2 weeks.  She states that she has had this before when she was diagnosed with BV.  She admits to feeling warm but has not taken a temperature at home.  She denies abnormal vaginal bleeding, vaginal irritation or itching, pain with urination, abdominal pain, nausea or vomiting.  She would also like to be tested for STI.  She reports 1 partner whom she uses condoms with every time they engage in sexual intercourse. LMP 08/28/14.   History Reviewed: non smoker.  ROS: All other systems reviewed and are negative.  Objective: Office vital signs reviewed. BP 110/80  Pulse 76  Temp(Src) 99.1 F (37.3 C) (Oral)  Ht 5\' 5"  (1.651 m)  Wt 152 lb 3.2 oz (69.037 kg)  BMI 25.33 kg/m2  LMP 08/28/2014  Physical Examination:  General: Awake, alert, well nourished, NAD HEENT: Atraumatic, normocephalic Abdomen: soft, NT/ND, +BS GU: normal appearing female external genitalia, no lesions or erythema; Normal appearing vaginal mucosa, profuse white discharge from cervix.  No cervical motion tenderness, no abdominal masses felt, no TTP to abdomen  Results for orders placed in visit on 10/03/14 (from the past 24 hour(s))  POCT URINALYSIS DIPSTICK     Status: Abnormal   Collection Time    10/03/14  2:30 PM      Result Value Ref Range   Color, UA yellow     Clarity, UA clear     Glucose, UA negative     Bilirubin, UA negative     Ketones, UA negative     Spec Grav, UA 1.020     Blood, UA negative     pH, UA 7.0     Protein, UA negative     Urobilinogen, UA 0.2     Nitrite, UA negative     Leukocytes, UA Trace    POCT WET PREP (WET MOUNT)     Status: Abnormal   Collection Time    10/03/14  2:38 PM      Result Value Ref Range   Source Wet Prep POC VAG     WBC, Wet Prep HPF POC >20     Bacteria Wet Prep HPF POC 3+ COCCI     Clue Cells Wet Prep HPF POC Many     Clue Cells Wet Prep Whiff POC Positive Whiff     Yeast Wet Prep HPF POC None     Trichomonas Wet Prep HPF POC None    POCT UA - MICROSCOPIC ONLY     Status: Abnormal   Collection Time    10/03/14  2:38 PM      Result Value Ref Range   WBC, Ur, HPF, POC 5-10     RBC, urine, microscopic RARE     Bacteria, U Microscopic 1+     Epithelial cells, urine per micros 5-10    POCT URINE PREGNANCY     Status: None   Collection Time    10/03/14  2:38 PM      Result Value Ref Range   Preg Test, Ur Negative     Negative bHCG  Assessment: 18 y.o. female with BV  Plan: See Problem List and After Visit Summary   Kamesha Herne  Windell Moulding, DO PGY-1, [place]

## 2014-10-03 NOTE — Assessment & Plan Note (Signed)
H/o BV 3 other times this year.  Low grade fever 99.1 today.  HPI, exam and studies consistent with BV.  Increased number of WBCs on wet prep make me suspicious for possible GC infection.  Recommend close follow up of this test.  -BetaHCG, UA, wetprep, GC/Chlamydia testing -Counseled on safe sex -Recommend probiotic for preservation of normal vaginal flora homeostasis -Flagyl 500 TID x7 days -Will contact with STI results. -Patient to call if symptoms worsen.

## 2014-10-03 NOTE — Patient Instructions (Addendum)
It was a pleasure seeing you today!  Information regarding what we discussed is included in this packet.  As we discussed, you can try supplementing with yogurt or taking a "probiotic" daily (such as Culturelle).  I will contact you with your lab results.  If anything is abnormal, I will call you with the results and instructions.  I have prescribed Metronidazole to take twice daily x7 days.  Please feel free to call our office if any questions or concerns arise.  Otherwise, plan to follow up with your primary doctor as needed.  Race Latour M. Leilanie Rauda, DO    Bacterial Vaginosis Bacterial vaginosis is an infection of the vagina. It happens when too many of certain germs (bacteria) grow in the vagina. HOME CARE  Take your medicine as told by your doctor.  Finish your medicine even if you start to feel better.  Do not have sex until you finish your medicine and are better.  Tell your sex partner that you have an infection. They should see their doctor for treatment.  Practice safe sex. Use condoms. Have only one sex partner. GET HELP IF:  You are not getting better after 3 days of treatment.  You have more grey fluid (discharge) coming from your vagina than before.  You have more pain than before.  You have a fever. MAKE SURE YOU:   Understand these instructions.  Will watch your condition.  Do not douche  Complete you treatment as directed.  Will get help right away if you are not doing well or get worse. Document Released: 09/20/2008 Document Revised: 10/02/2013 Document Reviewed: 07/24/2013 [facility] Patient Information 2015 [place], [place]. This information is not intended to replace advice given to you by your health care provider. Make sure you discuss any questions you have with your health care provider.

## 2014-10-07 LAB — CERVICOVAGINAL ANCILLARY ONLY
CHLAMYDIA, DNA PROBE: NEGATIVE
Neisseria Gonorrhea: NEGATIVE

## 2014-10-13 ENCOUNTER — Encounter: Payer: Self-pay | Admitting: Family Medicine

## 2015-02-20 ENCOUNTER — Ambulatory Visit (INDEPENDENT_AMBULATORY_CARE_PROVIDER_SITE_OTHER): Payer: Medicaid Other | Admitting: Family Medicine

## 2015-02-20 ENCOUNTER — Encounter: Payer: Self-pay | Admitting: Family Medicine

## 2015-02-20 ENCOUNTER — Other Ambulatory Visit (HOSPITAL_COMMUNITY)
Admission: RE | Admit: 2015-02-20 | Discharge: 2015-02-20 | Disposition: A | Discharge status: Home / Self Care | Payer: Medicaid Other | Source: Ambulatory Visit | Attending: Family Medicine | Admitting: Family Medicine

## 2015-02-20 VITALS — BP 99/68 | HR 80 | Temp 98.1°F | Ht 65.0 in | Wt 160.0 lb

## 2015-02-20 DIAGNOSIS — Z113 Encounter for screening for infections with a predominantly sexual mode of transmission: Secondary | ICD-10-CM | POA: Diagnosis present

## 2015-02-20 DIAGNOSIS — N760 Acute vaginitis: Secondary | ICD-10-CM | POA: Insufficient documentation

## 2015-02-20 LAB — POCT WET PREP (WET MOUNT): Clue Cells Wet Prep Whiff POC: POSITIVE

## 2015-02-20 MED ORDER — METRONIDAZOLE 500 MG PO TABS: 500.0000 mg | ORAL_TABLET | Freq: Two times a day (BID) | ORAL | Status: DC

## 2015-02-20 NOTE — Patient Instructions (Addendum)
We are checking for sexually transmitted infections. We will call you with the results. Take flagyl 500mg  twice a day for 7 days for BV Please follow-up with Dr. McDiarmid in a few weeks to discuss birth control.  Be well, Dr. Ardelia Mems   Bacterial Vaginosis Bacterial vaginosis is a vaginal infection that occurs when the normal balance of bacteria in the vagina is disrupted. It results from an overgrowth of certain bacteria. This is the most common vaginal infection in women of childbearing age. Treatment is important to prevent complications, especially in pregnant women, as it can cause a premature delivery. CAUSES  Bacterial vaginosis is caused by an increase in harmful bacteria that are normally present in smaller amounts in the vagina. Several different kinds of bacteria can cause bacterial vaginosis. However, the reason that the condition develops is not fully understood. RISK FACTORS Certain activities or behaviors can put you at an increased risk of developing bacterial vaginosis, including:  Having a new sex partner or multiple sex partners.  Douching.  Using an intrauterine device (IUD) for contraception. Women do not get bacterial vaginosis from toilet seats, bedding, swimming pools, or contact with objects around them. SIGNS AND SYMPTOMS  Some women with bacterial vaginosis have no signs or symptoms. Common symptoms include:  Grey vaginal discharge.  A fishlike odor with discharge, especially after sexual intercourse.  Itching or burning of the vagina and vulva.  Burning or pain with urination. DIAGNOSIS  Your health care provider will take a medical history and examine the vagina for signs of bacterial vaginosis. A sample of vaginal fluid may be taken. Your health care provider will look at this sample under a microscope to check for bacteria and abnormal cells. A vaginal pH test may also be done.  TREATMENT  Bacterial vaginosis may be treated with antibiotic medicines.  These may be given in the form of a pill or a vaginal cream. A second round of antibiotics may be prescribed if the condition comes back after treatment.  HOME CARE INSTRUCTIONS   Only take over-the-counter or prescription medicines as directed by your health care provider.  If antibiotic medicine was prescribed, take it as directed. Make sure you finish it even if you start to feel better.  Do not have sex until treatment is completed.  Tell all sexual partners that you have a vaginal infection. They should see their health care provider and be treated if they have problems, such as a mild rash or itching.  Practice safe sex by using condoms and only having one sex partner. SEEK MEDICAL CARE IF:   Your symptoms are not improving after 3 days of treatment.  You have increased discharge or pain.  You have a fever. MAKE SURE YOU:   Understand these instructions.  Will watch your condition.  Will get help right away if you are not doing well or get worse. FOR MORE INFORMATION  Centers for Disease Control and Prevention, Division of STD Prevention: [employer]raud[employer] American Sexual Health Association (ASHA): www.ashastd.org  Document Released: 12/12/2005 Document Revised: 10/02/2013 Document Reviewed: 07/24/2013 [employer] Patient Information 2015 [place], [place]. This information is not intended to replace advice given to you by your health care provider. Make sure you discuss any questions you have with your health care provider.

## 2015-02-21 LAB — HIV ANTIBODY (ROUTINE TESTING W REFLEX): HIV 1&2 Ab, 4th Generation: NONREACTIVE

## 2015-02-21 LAB — HEPATITIS PANEL, ACUTE
HCV Ab: NEGATIVE
Hep A IgM: NONREACTIVE
Hep B C IgM: NONREACTIVE
Hepatitis B Surface Ag: NEGATIVE

## 2015-02-21 LAB — SYPHILIS: RPR W/REFLEX TO RPR TITER AND TREPONEMAL ANTIBODIES, TRADITIONAL SCREENING AND DIAGNOSIS ALGORITHM

## 2015-02-21 NOTE — Progress Notes (Signed)
Patient ID: Kathleen Alexander, female   DOB: Jul 18, 1996, 19 y.o.   MRN: [id number]  HPI:  Pt presents for a same day appointment to discuss vaginal discharge.  Has hx of BV in the past and thinks it has recurred. Began yesterday. Has d/c that is thick and white. No odor, dysuria, fevers, pelvic pain. Sexually active with one female partner, does not use condoms regularly and is not on any other forms of birth control. She is interested in STD testing today.   ROS: See HPI  [place]: hx exercise induced bronchospasm, prior BV  PHYSICAL EXAM: BP 99/68 mmHg  Pulse 80  Temp(Src) 98.1 F (36.7 C) (Oral)  Ht 5\' 5"  (1.651 m)  Wt 160 lb (72.576 kg)  BMI 26.63 kg/m2  LMP 01/26/2015 Gen: NAD HEENT: NCAT GU: normal appearing external genitalia without lesions. Vagina is moist with white discharge. Some fishy odor present. Cervix normal in appearance. No cervical motion tenderness or tenderness on bimanual exam. No adnexal masses.   ASSESSMENT/PLAN:  1. Vaginal discharge: wet prep, hx, and exam consistent with BV. -Treat with flagyl 500mg  BID x 7 days.  2. Unprotected intercourse: -STD screening: will check gc/chlamydia/trichomonas, HIV, RPR, acute hepatitis panel today  -recommended pt schedule f/u appt with PCP to discuss contraceptive options  FOLLOW UP: F/u at pt's convenience with PCP for contraception discussion  [place] J. Ardelia Mems, [place]

## 2015-02-23 LAB — CERVICOVAGINAL ANCILLARY ONLY
Chlamydia: NEGATIVE
Neisseria Gonorrhea: NEGATIVE
Trichomonas: NEGATIVE

## 2015-02-26 ENCOUNTER — Telehealth: Payer: Self-pay | Admitting: *Deleted

## 2015-02-26 NOTE — Telephone Encounter (Signed)
Patient informed, expressed understanding. 

## 2015-02-26 NOTE — Telephone Encounter (Signed)
-----   Message from Leeanne Rio, MD sent at 02/23/2015  5:03 PM EST ----- Please inform patient that all STD tests were negative. She did have bacterial vaginosis, which we treated with the flagyl. Thanks, Leeanne Rio, MD

## 2015-05-26 ENCOUNTER — Encounter: Payer: Medicaid Other | Admitting: Family Medicine

## 2016-04-29 ENCOUNTER — Other Ambulatory Visit (HOSPITAL_COMMUNITY)
Admission: RE | Admit: 2016-04-29 | Discharge: 2016-04-29 | Disposition: A | Discharge status: Home / Self Care | Payer: Medicaid Other | Source: Ambulatory Visit | Attending: Family Medicine | Admitting: Family Medicine

## 2016-04-29 ENCOUNTER — Encounter: Payer: Self-pay | Admitting: Family Medicine

## 2016-04-29 ENCOUNTER — Ambulatory Visit (INDEPENDENT_AMBULATORY_CARE_PROVIDER_SITE_OTHER): Payer: Medicaid Other | Admitting: Family Medicine

## 2016-04-29 VITALS — BP 110/64 | HR 98 | Temp 98.9°F | Wt 142.0 lb

## 2016-04-29 DIAGNOSIS — A499 Bacterial infection, unspecified: Secondary | ICD-10-CM

## 2016-04-29 DIAGNOSIS — N760 Acute vaginitis: Secondary | ICD-10-CM

## 2016-04-29 DIAGNOSIS — K644 Residual hemorrhoidal skin tags: Secondary | ICD-10-CM

## 2016-04-29 DIAGNOSIS — Z202 Contact with and (suspected) exposure to infections with a predominantly sexual mode of transmission: Secondary | ICD-10-CM

## 2016-04-29 DIAGNOSIS — B9689 Other specified bacterial agents as the cause of diseases classified elsewhere: Secondary | ICD-10-CM

## 2016-04-29 DIAGNOSIS — K648 Other hemorrhoids: Secondary | ICD-10-CM

## 2016-04-29 DIAGNOSIS — Z113 Encounter for screening for infections with a predominantly sexual mode of transmission: Secondary | ICD-10-CM | POA: Insufficient documentation

## 2016-04-29 LAB — POCT WET PREP (WET MOUNT): CLUE CELLS WET PREP WHIFF POC: POSITIVE

## 2016-04-29 MED ORDER — POLYETHYLENE GLYCOL 3350 17 GM/SCOOP PO POWD: 8.5000 g | Freq: Every day | ORAL | Status: DC

## 2016-04-29 NOTE — Progress Notes (Signed)
   Subjective:    Patient ID: Kathleen Alexander, female    DOB: 1996/11/17, 20 y.o.   MRN: [id number]  HPI  Patient presents for Same Day Appointment  CC: bleeding  # Rectal bleeding:  Started 4 days ago, every time she would have a BM when wiping with the tissue it would get "soaked in bright red blood" -- bleeding seemed to last about 10 minutes. BM is "soft", but sometimes has to strain (not this week). Not painful at all  States she has a history of a hemorrhoid, treated last month with cream. It was hurting, but now better. ROS: no fevers, chills, no weakness, no fatigue/tired  # STD:  No new partners, same boyfriend 4 years  Last intercourse was 2 days ago  Discharge for past week, white  FH: no history of GI issues or IBD Social Hx: current marijuana smoker, occasional, not every day   Review of Systems   See HPI for ROS.   Past medical history, surgical, family, and social history reviewed and updated in the EMR as appropriate.  Objective:  BP 110/64 mmHg  Pulse 98  Temp(Src) 98.9 F (37.2 C) (Oral)  Wt 142 lb (64.411 kg)  LMP 04/07/2016 (Approximate) Vitals and nursing note reviewed  General: no apparent distress  Abdomen: soft, nontender, nondistended, normal bowel sounds  Rectal: exam done with chaperone present. There is a non-bleeding, non-thrombosed hemorrhoid present, no appreciable internal hemorrhoid on attempted anoscopy (though patient was uncomfortable with this and unable to get good visualization). GU: normal external genitalia. Normal appearing vaginal mucosa, there is a small amount of discharge present, normal appearing cervix.  Assessment & Plan:  1. External hemorrhoid Discussed conservative management, adding daily miralax/fiber to titrate to 1 soft BM per day. Continue OTC hemorrhoid steroid cream. Follow up if not improving, develops worsening pain, bleeding continues/persistent/worsens.  2. Possible exposure to STD Wet prep positive  for BV. Pending GC/C, will call patient with resutls when this comes back and discuss treatment for BV with flagyl. - POCT Wet Prep [facility]) - Cervicovaginal ancillary only

## 2016-04-29 NOTE — Patient Instructions (Addendum)
Okay to continue the over the counter hemorrhoid cream  Miralax: start with 1/2 packet or 1/2 capful a day. Your goal is 1 soft bowel movement a day. If you are not hitting this goal increase to 1 capful a day, and if still not increase by 1 capful every day (max use per day of 5 capful/packets)  When you have a BM, try to lean forward into your knees  We will call you with the results of the other testing.

## 2016-05-02 LAB — CERVICOVAGINAL ANCILLARY ONLY
CHLAMYDIA, DNA PROBE: NEGATIVE
Neisseria Gonorrhea: NEGATIVE

## 2016-05-10 ENCOUNTER — Encounter: Payer: Self-pay | Admitting: Family Medicine

## 2016-05-10 ENCOUNTER — Telehealth: Payer: Self-pay | Admitting: Family Medicine

## 2016-05-10 NOTE — Telephone Encounter (Signed)
Called and left VM to return call. GC/C testing was negative at last visit, wet prep did show some evidence for BV and was going to discuss if she was having any discharge/issues and if she would like to be treated. -Dr. Lamar Benes

## 2016-05-12 MED ORDER — METRONIDAZOLE 500 MG PO TABS: 500.0000 mg | ORAL_TABLET | Freq: Two times a day (BID) | ORAL | Status: DC

## 2016-05-12 NOTE — Telephone Encounter (Signed)
2nd phone call. Will send in rx for flagyl and letter to be mailed.

## 2016-06-15 ENCOUNTER — Ambulatory Visit: Payer: Medicaid Other | Admitting: Family Medicine

## 2016-06-16 ENCOUNTER — Ambulatory Visit (INDEPENDENT_AMBULATORY_CARE_PROVIDER_SITE_OTHER): Payer: Self-pay | Admitting: Family Medicine

## 2016-06-16 ENCOUNTER — Encounter: Payer: Self-pay | Admitting: Family Medicine

## 2016-06-16 VITALS — BP 105/57 | HR 80 | Temp 98.0°F | Wt 142.0 lb

## 2016-06-16 DIAGNOSIS — L03011 Cellulitis of right finger: Secondary | ICD-10-CM | POA: Insufficient documentation

## 2016-06-16 MED ORDER — DOXYCYCLINE HYCLATE 100 MG PO TABS
100.0000 mg | ORAL_TABLET | Freq: Two times a day (BID) | ORAL | Status: DC
Start: 2016-06-16 — End: 2016-06-20

## 2016-06-16 NOTE — Assessment & Plan Note (Signed)
Paronychia of right middle finger.  Swelling extends to lateral distal phalanges, but does not involve the volar aspect of finger pulp. Due to concern for progressing to felon. I and D performed in clinic and oral antibiotics were prescribed.  - Doxycycline 100 mg twice a day 7 days - Advised soaking in warm water 3 times a day - Follow-up in 3-4 days to ensure improvement, or sooner if develops worsening pain, swelling, or fevers.  May need referral to hand surgery if not improving

## 2016-06-16 NOTE — Progress Notes (Signed)
   Subjective:    Patient ID: Kathleen Alexander, female    DOB: 03-16-1996, 20 y.o.   MRN: [id number]  Seen for Same day visit for   CC: Right middle finger pain  She reports sharp, constant pain and swelling of her distal right middle finger for the past 2 weeks.  Has had previous paronychia.  Often bites her fingernails.  She reports attempting to drain the infection herself, but the swelling has been getting worse.  Denies any pain in the volar fingertip.   Social: Smoking history noted  Review of Systems   See HPI for ROS. Objective:  BP 105/57 mmHg  Pulse 80  Temp(Src) 98 F (36.7 C) (Oral)  Wt 142 lb (64.411 kg)  SpO2 100%  LMP 04/25/2016  General: NAD Right middle finger: Erythema and swelling of the lateral nail fold and dorsal aspect of distal phalanges; no erythema, swelling or pain in the volar aspect of distal phalanges    Assessment & Plan:   Discussed examination findings, diagnosis, usual course of illness, and options for therapy discussed. After discussing recommendations, patient agrees to I&D of paronychia and oral antibiotics.    Procedure Informed consent obtained.  The area was prepped in the usual manner and the third digit block was performed with 2cc of 2% plain lidocaine.  Paronychia  was sharply incised and approx 0.5ccs of purulent material was obtained. Wound was covered with sterile bandage.    Advised patient to complete the course of oral antibiotics, use warm compresses or heat applied to the area to promote drainage.  Follow up: 3-4 days.  However, if worse signs of infections as discussed (fever, chills, nausea, vomiting, worsening redness, worsening pain), then call or return immediately.  Paronychia of finger of right hand Paronychia of right middle finger.  Swelling extends to lateral distal phalanges, but does not involve the volar aspect of finger pulp. Due to concern for progressing to felon. I and D performed in clinic and oral  antibiotics were prescribed.  - Doxycycline 100 mg twice a day 7 days - Advised soaking in warm water 3 times a day - Follow-up in 3-4 days to ensure improvement, or sooner if develops worsening pain, swelling, or fevers.  May need referral to hand surgery if not improving

## 2016-06-16 NOTE — Patient Instructions (Addendum)
You have a peronychia which is an infection of your finger nailbed.  We have drained some of the infection today, but you still require antibiotics.  - Take doxycycline 100 mg twice a day for the next 7 days - Use warm soaks 15-20 minutes 2-3 times a day until swelling resolves - Then reapply topical antibiotic cream and wrapped with gauze - Call if you develop any fevers, worsening pain, swelling or redness - Return to clinic on Monday for reevaluation  Paronychia  Paronychia is an infection of the skin. It happens near a fingernail or toenail. It may cause pain and swelling around the nail. Usually, it is not serious and it clears up with treatment. HOME CARE  Soak the fingers or toes in warm water as told by your doctor. You may be told to do this for 20 minutes, 2-3 times a day.  Keep the area dry when you are not soaking it.  Take medicines only as told by your doctor.  If you were given an antibiotic medicine, finish all of it even if you start to feel better.  Keep the affected area clean.  Do not try to drain a fluid-filled bump yourself.  Wear rubber gloves when putting your hands in water.  Wear gloves if your hands might touch cleaners or chemicals.  Follow your doctor's instructions about:  Wound care.  Bandage (dressing) changes and removal. GET HELP IF:  Your symptoms get worse or do not improve.  You have a fever or chills.  You have redness spreading from the affected area.  You have more fluid, blood, or pus coming from the affected area.  Your finger or knuckle is swollen or is hard to move.   This information is not intended to replace advice given to you by your health care provider. Make sure you discuss any questions you have with your health care provider.   Document Released: 11/30/2009 Document Revised: 04/28/2015 Document Reviewed: 11/19/2014 Elsevier Interactive Patient Education [employer].

## 2016-06-20 ENCOUNTER — Ambulatory Visit (INDEPENDENT_AMBULATORY_CARE_PROVIDER_SITE_OTHER): Payer: Self-pay | Admitting: Family Medicine

## 2016-06-20 ENCOUNTER — Encounter: Payer: Self-pay | Admitting: Family Medicine

## 2016-06-20 VITALS — BP 111/60 | HR 81 | Temp 98.7°F | Ht 65.0 in | Wt 142.0 lb

## 2016-06-20 DIAGNOSIS — L03019 Cellulitis of unspecified finger: Secondary | ICD-10-CM

## 2016-06-20 DIAGNOSIS — L03011 Cellulitis of right finger: Secondary | ICD-10-CM

## 2016-06-20 DIAGNOSIS — IMO0002 Reserved for concepts with insufficient information to code with codable children: Secondary | ICD-10-CM

## 2016-06-20 LAB — POCT URINE PREGNANCY: Preg Test, Ur: NEGATIVE

## 2016-06-20 MED ORDER — METRONIDAZOLE 500 MG PO TABS: 500.0000 mg | ORAL_TABLET | Freq: Three times a day (TID) | ORAL | Status: DC

## 2016-06-20 MED ORDER — SULFAMETHOXAZOLE-TRIMETHOPRIM 400-80 MG PO TABS: 1.0000 | ORAL_TABLET | Freq: Two times a day (BID) | ORAL | Status: DC

## 2016-06-20 NOTE — Patient Instructions (Signed)
Sent in the 2 antibiotics for you:  Take then for 5 days total One is 3 times a day (metronidazole) The other is 2 times a day (bactrim)  No alcohol while on the metronidazole  Follow up Friday for recheck, sooner if worsening  Be well, Dr. Bertram Denver Paronychia is an infection of the skin that surrounds a nail. It usually affects the skin around a fingernail, but it may also occur near a toenail. It often causes pain and swelling around the nail. This condition may come on suddenly or develop over a longer period. In some cases, a collection of pus (abscess) can form near or under the nail. Usually, paronychia is not serious and it clears up with treatment. CAUSES This condition may be caused by bacteria or fungi. It is commonly caused by either Streptococcus or Staphylococcus bacteria. The bacteria or fungi often cause the infection by getting into the affected area through an opening in the skin, such as a cut or a hangnail. RISK FACTORS This condition is more likely to develop in:  People who get their hands wet often, such as those who work as Designer, industrial/product, bartenders, or nurses.  People who bite their fingernails or suck their thumbs.  People who trim their nails too short.  People who have hangnails or injured fingertips.  People who get manicures.  People who have diabetes. SYMPTOMS Symptoms of this condition include:  Redness and swelling of the skin near the nail.  Tenderness around the nail when you touch the area.  Pus-filled bumps under the cuticle. The cuticle is the skin at the base or sides of the nail.  Fluid or pus under the nail.  Throbbing pain in the area. DIAGNOSIS This condition is usually diagnosed with a physical exam. In some cases, a sample of pus may be taken from an abscess to be tested in a lab. This can help to determine what type of bacteria or fungi is causing the condition. TREATMENT Treatment for this condition depends on the  cause and severity of the condition. If the condition is mild, it may clear up on its own in a few days. Your health care provider may recommend soaking the affected area in warm water a few times a day. When treatment is needed, the options may include:  Antibiotic medicine, if the condition is caused by a bacterial infection.  Antifungal medicine, if the condition is caused by a fungal infection.  Incision and drainage, if an abscess is present. In this procedure, the health care provider will cut open the abscess so the pus can drain out. HOME CARE INSTRUCTIONS  Soak the affected area in warm water if directed to do so by your health care provider. You may be told to do this for 20 minutes, 2-3 times a day. Keep the area dry in between soakings.  Take medicines only as directed by your health care provider.  If you were prescribed an antibiotic medicine, finish all of it even if you start to feel better.  Keep the affected area clean.  Do not try to drain a fluid-filled bump yourself.  If you will be washing dishes or performing other tasks that require your hands to get wet, wear rubber gloves. You should also wear gloves if your hands might come in contact with irritating substances, such as cleaners or chemicals.  Follow your health care provider's instructions about:  Wound care.  Bandage (dressing) changes and removal. SEEK MEDICAL CARE IF:  Your  symptoms get worse or do not improve with treatment.  You have a fever or chills.  You have redness spreading from the affected area.  You have continued or increased fluid, blood, or pus coming from the affected area.  Your finger or knuckle becomes swollen or is difficult to move.   This information is not intended to replace advice given to you by your health care provider. Make sure you discuss any questions you have with your health care provider.   Document Released: 06/07/2001 Document Revised: 04/28/2015 Document  Reviewed: 11/19/2014 Elsevier Interactive Patient Education [employer].

## 2016-06-20 NOTE — Progress Notes (Signed)
Date of Visit: 06/20/2016   HPI:  Patient presents to follow up on finger paronychia. Was seen on Friday and had I&D of paronychia performed, with drainage of purulent material. Sent home with rx for doxycycline & instructed to follow up today.  Patient reports finger overall doing better. No fevers. Eating and drinking well. Was not able to get the doxycycline due to cost (over 40 dollars). Has been soaking the finger & sometimes pus comes out with this.  ROS: See HPI.  [place]: history of exercise induced bronchospasm, paronychia of finger of R hand  PHYSICAL EXAM: BP 111/60 mmHg  Pulse 81  Temp(Src) 98.7 F (37.1 C) (Oral)  Ht 5\' 5"  (1.651 m)  Wt 142 lb (64.411 kg)  BMI 23.63 kg/m2  LMP 04/25/2016 Gen: NAD, pleasant, cooperative, well appaering Ext: R middle finger with erythema and swelling around nailbed, sparing finger pulp. Minimally tender to palpation. No drainage.   ASSESSMENT/PLAN:  Paronychia of finger of right hand Improved. Still would likely benefit from systemic antibiotics though not able to afford doxycycline. -rx bactrim & flagyl for optimal paronychia coverage, since she bites her nail. Cost confirmed to be ~21 dollars at pharmacy, patient states she can afford this -follow up Friday for recheck   FOLLOW UP: Follow up in 4 days for recheck finger  [place] J. Ardelia Mems, [place]

## 2016-06-22 NOTE — Assessment & Plan Note (Signed)
Improved. Still would likely benefit from systemic antibiotics though not able to afford doxycycline. -rx bactrim & flagyl for optimal paronychia coverage, since she bites her nail. Cost confirmed to be ~21 dollars at pharmacy, patient states she can afford this -follow up Friday for recheck

## 2016-07-07 ENCOUNTER — Ambulatory Visit: Payer: Medicaid Other | Admitting: Family Medicine

## 2016-07-08 ENCOUNTER — Ambulatory Visit (INDEPENDENT_AMBULATORY_CARE_PROVIDER_SITE_OTHER): Payer: Self-pay | Admitting: Internal Medicine

## 2016-07-08 ENCOUNTER — Encounter: Payer: Self-pay | Admitting: Internal Medicine

## 2016-07-08 VITALS — BP 89/33 | HR 96 | Temp 98.6°F | Ht 65.0 in | Wt 142.0 lb

## 2016-07-08 DIAGNOSIS — L03011 Cellulitis of right finger: Secondary | ICD-10-CM

## 2016-07-08 NOTE — Patient Instructions (Signed)
Your finger does not look infected and seems to be healing well. You do not need an antibiotic right now.   Please return if you start having drainage, redness, or pain at that site.   Continue with your warm soapy soaks.

## 2016-07-08 NOTE — Progress Notes (Signed)
   Subjective:    AALIVIA HANSER - 20 y.o. female MRN [id number]  Date of birth: 07-08-1996  HPI  SHENANDOAH ROBILLARD is here for f/u of paronychia.  Paronychia: Of right middle finger. Was I&D'ed in clinic on 6/22. Patient was seen again on 6/26 and had not filled antibiotic prescribed. Was sent home with Bactrim and Flagyl, as she had not been able to afford Doxycycline initially prescribed. Presents today because her mom told her she should be seen and was worried her daughter's finger would fall off. She denies pain or redness to the area. Last time she had drainage from the area was at her last clinic visit about 3 weeks ago. Has good ROM of the finger. Has been doing warm water soaks at home. Notes some swelling to the area but says it was significantly more swollen in the past and has been improving. Has been afebrile and without N/V at home.    -  reports that she has never smoked. She does not have any smokeless tobacco history on file. - Review of Systems: Per HPI. - Past Medical History: Patient Active Problem List   Diagnosis Date Noted  . Paronychia of finger of right hand 06/16/2016  . Dysuria 05/29/2014  . Bacterial vaginosis 03/31/2014  . Vaginitis and vulvovaginitis 03/20/2014  . 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 VISUAL ACUITY, DECREASED 10/06/2008  . DEFICIENCY, NUTRITIONAL NOS 10/01/2007  . SCAR/FIBROSIS, SKIN 10/01/2007  . DERMATOFIBROMA 04/02/2007  . TENSION HEADACHE 02/22/2007  . 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 DRY SKIN 02/22/2007  . ACNE 02/22/2007   - Medications: reviewed and updated    Objective:   Physical Exam BP 89/33 mmHg  Pulse 96  Temp(Src) 98.6 F (37 C) (Oral)  Ht 5\' 5"  (1.651 m)  Wt 142 lb (64.411 kg)  BMI 23.63 kg/m2  LMP 06/24/2016 Gen: NAD, alert, cooperative with exam, well-appearing Right Middle Finger: Some mild edema and hyperpigmentation to the lateral nail fold. No erythema or drainage in that area. No increased  warmth to touch. Full ROM. Not TTP.     Assessment & Plan:   Paronychia of finger of right hand Improved and without any signs of infection. Do not believe warrants systemic antibiotics at this point as it has been several weeks since procedure and patient did not have any infectious symptoms since last OV.  -continue with warm water soaks  -return precautions for infectious signs given      Phill Myron, D.O. 07/08/2016, 2:59 PM PGY-2, [place]

## 2016-07-08 NOTE — Assessment & Plan Note (Signed)
Improved and without any signs of infection. Do not believe warrants systemic antibiotics at this point as it has been several weeks since procedure and patient did not have any infectious symptoms since last OV.  -continue with warm water soaks  -return precautions for infectious signs given

## 2016-10-21 ENCOUNTER — Ambulatory Visit (INDEPENDENT_AMBULATORY_CARE_PROVIDER_SITE_OTHER): Payer: Self-pay | Admitting: Family Medicine

## 2016-10-21 VITALS — Temp 98.3°F | Ht 65.0 in | Wt 144.8 lb

## 2016-10-21 DIAGNOSIS — N3000 Acute cystitis without hematuria: Secondary | ICD-10-CM

## 2016-10-21 DIAGNOSIS — N760 Acute vaginitis: Secondary | ICD-10-CM

## 2016-10-21 DIAGNOSIS — B9689 Other specified bacterial agents as the cause of diseases classified elsewhere: Secondary | ICD-10-CM

## 2016-10-21 DIAGNOSIS — N898 Other specified noninflammatory disorders of vagina: Secondary | ICD-10-CM

## 2016-10-21 LAB — POCT WET PREP (WET MOUNT)
CLUE CELLS WET PREP WHIFF POC: POSITIVE
Trichomonas Wet Prep HPF POC: ABSENT

## 2016-10-21 LAB — POCT UA - MICROSCOPIC ONLY

## 2016-10-21 LAB — POCT URINALYSIS DIPSTICK
BILIRUBIN UA: NEGATIVE
Blood, UA: NEGATIVE
Glucose, UA: NEGATIVE
KETONES UA: NEGATIVE
LEUKOCYTES UA: NEGATIVE
Nitrite, UA: POSITIVE
Protein, UA: NEGATIVE
Urobilinogen, UA: 1
pH, UA: 6

## 2016-10-21 MED ORDER — METRONIDAZOLE 500 MG PO TABS: 500.0000 mg | ORAL_TABLET | Freq: Two times a day (BID) | ORAL | 0 refills | Status: DC

## 2016-10-21 MED ORDER — CEPHALEXIN 500 MG PO CAPS: 500.0000 mg | ORAL_CAPSULE | Freq: Two times a day (BID) | ORAL | 0 refills | Status: DC

## 2016-10-21 NOTE — Patient Instructions (Signed)
Thank you so much for coming to visit today! Your Wet Prep was positive for Bacterial Vaginosis. I have sent a prescription for Metronidazole to the pharmacy to use twice daily for 7 days. Your urinalysis showed signs of UTI. I have sent a prescription for Keflex to take twice daily for 7 days. Please return as needed.  Dr. Gerlean Ren   Bacterial Vaginosis Bacterial vaginosis is a vaginal infection that occurs when the normal balance of bacteria in the vagina is disrupted. It results from an overgrowth of certain bacteria. This is the most common vaginal infection in women of childbearing age. Treatment is important to prevent complications, especially in pregnant women, as it can cause a premature delivery. CAUSES  Bacterial vaginosis is caused by an increase in harmful bacteria that are normally present in smaller amounts in the vagina. Several different kinds of bacteria can cause bacterial vaginosis. However, the reason that the condition develops is not fully understood. RISK FACTORS Certain activities or behaviors can put you at an increased risk of developing bacterial vaginosis, including:  Having a new sex partner or multiple sex partners.  Douching.  Using an intrauterine device (IUD) for contraception. Women do not get bacterial vaginosis from toilet seats, bedding, swimming pools, or contact with objects around them. SIGNS AND SYMPTOMS  Some women with bacterial vaginosis have no signs or symptoms. Common symptoms include:  Grey vaginal discharge.  A fishlike odor with discharge, especially after sexual intercourse.  Itching or burning of the vagina and vulva.  Burning or pain with urination. DIAGNOSIS  Your health care provider will take a medical history and examine the vagina for signs of bacterial vaginosis. A sample of vaginal fluid may be taken. Your health care provider will look at this sample under a microscope to check for bacteria and abnormal cells. A vaginal pH  test may also be done.  TREATMENT  Bacterial vaginosis may be treated with antibiotic medicines. These may be given in the form of a pill or a vaginal cream. A second round of antibiotics may be prescribed if the condition comes back after treatment. Because bacterial vaginosis increases your risk for sexually transmitted diseases, getting treated can help reduce your risk for chlamydia, gonorrhea, HIV, and herpes. HOME CARE INSTRUCTIONS   Only take over-the-counter or prescription medicines as directed by your health care provider.  If antibiotic medicine was prescribed, take it as directed. Make sure you finish it even if you start to feel better.  Tell all sexual partners that you have a vaginal infection. They should see their health care provider and be treated if they have problems, such as a mild rash or itching.  During treatment, it is important that you follow these instructions:  Avoid sexual activity or use condoms correctly.  Do not douche.  Avoid alcohol as directed by your health care provider.  Avoid breastfeeding as directed by your health care provider. SEEK MEDICAL CARE IF:   Your symptoms are not improving after 3 days of treatment.  You have increased discharge or pain.  You have a fever. MAKE SURE YOU:   Understand these instructions.  Will watch your condition.  Will get help right away if you are not doing well or get worse. FOR MORE INFORMATION  Centers for Disease Control and Prevention, Division of STD Prevention: [employer] American Sexual Health Association (ASHA): www.ashastd.org    This information is not intended to replace advice given to you by your health care provider. Make sure you discuss any  questions you have with your health care provider.   Document Released: 12/12/2005 Document Revised: 01/02/2015 Document Reviewed: 07/24/2013 Elsevier Interactive Patient Education 2016 Elsevier Inc.   Urinary Tract Infection Urinary tract  infections (UTIs) can develop anywhere along your urinary tract. Your urinary tract is your body's drainage system for removing wastes and extra water. Your urinary tract includes two kidneys, two ureters, a bladder, and a urethra. Your kidneys are a pair of bean-shaped organs. Each kidney is about the size of your fist. They are located below your ribs, one on each side of your spine. CAUSES Infections are caused by microbes, which are microscopic organisms, including fungi, viruses, and bacteria. These organisms are so small that they can only be seen through a microscope. Bacteria are the microbes that most commonly cause UTIs. SYMPTOMS  Symptoms of UTIs may vary by age and gender of the patient and by the location of the infection. Symptoms in young women typically include a frequent and intense urge to urinate and a painful, burning feeling in the bladder or urethra during urination. Older women and men are more likely to be tired, shaky, and weak and have muscle aches and abdominal pain. A fever may mean the infection is in your kidneys. Other symptoms of a kidney infection include pain in your back or sides below the ribs, nausea, and vomiting. DIAGNOSIS To diagnose a UTI, your caregiver will ask you about your symptoms. Your caregiver will also ask you to provide a urine sample. The urine sample will be tested for bacteria and white blood cells. White blood cells are made by your body to help fight infection. TREATMENT  Typically, UTIs can be treated with medication. Because most UTIs are caused by a bacterial infection, they usually can be treated with the use of antibiotics. The choice of antibiotic and length of treatment depend on your symptoms and the type of bacteria causing your infection. HOME CARE INSTRUCTIONS  If you were prescribed antibiotics, take them exactly as your caregiver instructs you. Finish the medication even if you feel better after you have only taken some of the  medication.  Drink enough water and fluids to keep your urine clear or pale yellow.  Avoid caffeine, tea, and carbonated beverages. They tend to irritate your bladder.  Empty your bladder often. Avoid holding urine for long periods of time.  Empty your bladder before and after sexual intercourse.  After a bowel movement, women should cleanse from front to back. Use each tissue only once. SEEK MEDICAL CARE IF:   You have back pain.  You develop a fever.  Your symptoms do not begin to resolve within 3 days. SEEK IMMEDIATE MEDICAL CARE IF:   You have severe back pain or lower abdominal pain.  You develop chills.  You have nausea or vomiting.  You have continued burning or discomfort with urination. MAKE SURE YOU:   Understand these instructions.  Will watch your condition.  Will get help right away if you are not doing well or get worse.   This information is not intended to replace advice given to you by your health care provider. Make sure you discuss any questions you have with your health care provider.   Document Released: 09/21/2005 Document Revised: 09/02/2015 Document Reviewed: 01/20/2012 Elsevier Interactive Patient Education [employer].

## 2016-10-24 NOTE — Progress Notes (Signed)
Subjective:     Patient ID: Kathleen Alexander, female   DOB: 02-24-1996, 20 y.o.   MRN: [id number]  HPI Kathleen Alexander. -Reports vaginal discharge 2 weeks -Denies dysuria, but does note increased frequency, increased urgency, and odor to urine the last 2 weeks -Denies fever -Reports history of bacterial vaginosis. Reports this feels the same. [name]-last section reactive 2 weeks ago. Does not use protection. Requests gonorrhea chlamydia testing, refuses HIV and syphilis testing. -Denies abdominal pain, vaginal itching, genital ulcers, vaginal bleeding -Last menstrual period October 3 -Nonsmoker  Review of Systems Per HPI.    Objective:   Physical Exam  Constitutional: She appears well-developed and well-nourished. No distress.  Cardiovascular: Normal rate and regular rhythm.   No murmur heard. Pulmonary/Chest: No respiratory distress. She has no wheezes.  Abdominal: Soft. She exhibits no distension. There is no tenderness.  No CVA tenderness.  Genitourinary:  Genitourinary Comments: White vaginal discharge noted. No genital ulcers. No vaginal wall tenderness.  Psychiatric: She has a normal mood and affect. Her behavior is normal.       Assessment and Plan:     1. Bacterial vaginosis -Wet prep with moderate clue cells, few bacteria, positive with -Course of metronidazole sedated to pharmacy -Screening for gonorrhea and Chlamydia also obtained -Follow-up when necessary  2. Acute cystitis without hematuria -Urinalysis with 3+ rods, positive nitrites -Prescription for Keflex sent to pharmacy

## 2016-12-20 ENCOUNTER — Encounter (HOSPITAL_COMMUNITY): Payer: Self-pay | Admitting: *Deleted

## 2016-12-20 DIAGNOSIS — R52 Pain, unspecified: Secondary | ICD-10-CM | POA: Insufficient documentation

## 2016-12-20 DIAGNOSIS — Z5321 Procedure and treatment not carried out due to patient leaving prior to being seen by health care provider: Secondary | ICD-10-CM | POA: Insufficient documentation

## 2016-12-20 MED ORDER — ACETAMINOPHEN 325 MG PO TABS
650.0000 mg | ORAL_TABLET | Freq: Once | ORAL | Status: AC
  Administered 2016-12-20: 650 mg via ORAL
  Filled 2016-12-20: qty 2

## 2016-12-20 NOTE — ED Triage Notes (Signed)
The pt is c/o generalized body aches  Elevated temp cold cough headache for 3 days  lmp now

## 2016-12-21 ENCOUNTER — Ambulatory Visit (INDEPENDENT_AMBULATORY_CARE_PROVIDER_SITE_OTHER): Payer: Self-pay | Admitting: Internal Medicine

## 2016-12-21 ENCOUNTER — Emergency Department (HOSPITAL_COMMUNITY)
Admission: EM | Admit: 2016-12-21 | Discharge: 2016-12-21 | Disposition: A | Discharge status: Home / Self Care | Payer: Medicaid Other | Attending: Dermatology | Admitting: Dermatology

## 2016-12-21 DIAGNOSIS — J111 Influenza due to unidentified influenza virus with other respiratory manifestations: Secondary | ICD-10-CM | POA: Insufficient documentation

## 2016-12-21 MED ORDER — ONDANSETRON 4 MG PO TBDP: 4.0000 mg | ORAL_TABLET | Freq: Three times a day (TID) | ORAL | 0 refills | Status: DC | PRN

## 2016-12-21 MED ORDER — IBUPROFEN 800 MG PO TABS: 800.0000 mg | ORAL_TABLET | Freq: Three times a day (TID) | ORAL | 0 refills | Status: DC | PRN

## 2016-12-21 NOTE — Patient Instructions (Signed)
I hope you start feeling better soon!  I have prescribed you some Zofran to help with the nausea. I have also prescribed you some Ibuprofen to help with the muscle aches. You can alternate the Ibuprofen with Tylenol.  If you are not feeling better in the next week, please come back to see [place]!  -Dr. Brett Albino

## 2016-12-21 NOTE — ED Notes (Signed)
Per pt mother pt insurance is covered at family practice and do not feel the need to stay here longer and that pt feels better with Tylenol administration. Pt alert and oriented, nad noted.

## 2016-12-21 NOTE — Assessment & Plan Note (Signed)
Pt with signs and symptoms consistent with influenza. She has never gotten a flu shot before. She has been vomiting and has mildly dry mucous membranes but her vitals are completely stable and she is able to keep down water in the exam room. She also has brisk cap refill. No indication for influenza testing as it will not change management. Her symptoms have been going on for 5 days so she is outside the window for Tamiflu. - Will prescribe some Zofran for the nausea and Ibuprofen 800mg  tid prn for body aches. Advised Pt that she can also use alternate the Ibuprofen with Tylenol. - Work note provided per patient request - Follow-up in 1 week if not improving. We discussed that flu symptoms can last up to 2 weeks - Return precautions reviewed

## 2016-12-21 NOTE — Progress Notes (Signed)
   [facility] Phone: [phone]  Subjective:  Kathleen Alexander is a 20 year old female with no significant PMH presenting to clinic with 5 days of vomiting, body aches, fevers, chills, diarrhea, cough, and sore throat. She has not checked her temperature at home. She states she has been throwing up both liquids and solids. She denies any sick contacts. She has never gotten a flu shot.  ROS: See HPI for pertinent positives and negatives  Past Medical History- exercise-induced bronchospasm, tension headaches  Family history reviewed for today's visit. No changes.  Social history- patient is a never smoker  Objective: Pulse 74   Temp 99.4 F (37.4 C) (Oral)   Ht 5\' 5"  (1.651 m)   Wt 130 lb (59 kg)   LMP 12/20/2016   SpO2 99%   BMI 21.63 kg/m  Gen: Ill-appearing but non-toxic, laying on exam table, alert HEENT: NCAT, EOMI, TMs clear, nasal turbinates erythematous and edematous, oropharynx mildly erythematous without exudate, mildly dry mucous membranes Neck: FROM, supple, no cervical lymphadenopathy CV: RRR, no murmur, brisk cap refill. Resp: CTABL, no wheezes, normal work of breathing GI: SNTND, BS present, no guarding or organomegaly Msk: No edema, warm, normal tone, moves UE/LE spontaneously Neuro: Alert and oriented, no gross deficits  Assessment/Plan: Influenza: Pt with signs and symptoms consistent with influenza. She has never gotten a flu shot before. She has been vomiting and has mildly dry mucous membranes but her vitals are completely stable and she is able to keep down water in the exam room. She also has brisk cap refill. No indication for influenza testing as it will not change management. Her symptoms have been going on for 5 days so she is outside the window for Tamiflu. - Will prescribe some Zofran for the nausea and Ibuprofen 800mg  tid prn for body aches. Advised Pt that she can also use alternate the Ibuprofen with Tylenol. - Work note provided per  patient request - Follow-up in 1 week if not improving. We discussed that flu symptoms can last up to 2 weeks - Return precautions reviewed   Hyman Bible, MD PGY-2

## 2017-03-15 ENCOUNTER — Other Ambulatory Visit (HOSPITAL_COMMUNITY)
Admission: RE | Admit: 2017-03-15 | Discharge: 2017-03-15 | Disposition: A | Discharge status: Home / Self Care | Payer: Medicaid Other | Source: Ambulatory Visit | Attending: Family Medicine | Admitting: Family Medicine

## 2017-03-15 ENCOUNTER — Encounter: Payer: Self-pay | Admitting: Student

## 2017-03-15 ENCOUNTER — Telehealth: Payer: Self-pay | Admitting: Family Medicine

## 2017-03-15 ENCOUNTER — Ambulatory Visit (INDEPENDENT_AMBULATORY_CARE_PROVIDER_SITE_OTHER): Payer: Self-pay | Admitting: Student

## 2017-03-15 VITALS — BP 100/70 | HR 93 | Temp 99.1°F | Wt 137.0 lb

## 2017-03-15 DIAGNOSIS — Z124 Encounter for screening for malignant neoplasm of cervix: Secondary | ICD-10-CM | POA: Insufficient documentation

## 2017-03-15 DIAGNOSIS — N870 Mild cervical dysplasia: Secondary | ICD-10-CM | POA: Insufficient documentation

## 2017-03-15 DIAGNOSIS — R87618 Other abnormal cytological findings on specimens from cervix uteri: Secondary | ICD-10-CM | POA: Insufficient documentation

## 2017-03-15 DIAGNOSIS — R300 Dysuria: Secondary | ICD-10-CM

## 2017-03-15 DIAGNOSIS — R109 Unspecified abdominal pain: Secondary | ICD-10-CM

## 2017-03-15 DIAGNOSIS — R87612 Low grade squamous intraepithelial lesion on cytologic smear of cervix (LGSIL): Secondary | ICD-10-CM

## 2017-03-15 DIAGNOSIS — A749 Chlamydial infection, unspecified: Secondary | ICD-10-CM | POA: Insufficient documentation

## 2017-03-15 DIAGNOSIS — Z0000 Encounter for general adult medical examination without abnormal findings: Secondary | ICD-10-CM | POA: Insufficient documentation

## 2017-03-15 DIAGNOSIS — N898 Other specified noninflammatory disorders of vagina: Secondary | ICD-10-CM

## 2017-03-15 HISTORY — DX: Low grade squamous intraepithelial lesion on cytologic smear of cervix (LGSIL): R87.612

## 2017-03-15 LAB — POCT URINALYSIS DIP (MANUAL ENTRY)
Blood, UA: NEGATIVE
GLUCOSE UA: NEGATIVE
Ketones, POC UA: NEGATIVE
Leukocytes, UA: NEGATIVE
NITRITE UA: NEGATIVE
Protein Ur, POC: NEGATIVE
UROBILINOGEN UA: 0.2 (ref ?–2.0)
pH, UA: 6 (ref 5.0–8.0)

## 2017-03-15 LAB — POCT WET PREP (WET MOUNT)
Clue Cells Wet Prep Whiff POC: POSITIVE
Trichomonas Wet Prep HPF POC: ABSENT

## 2017-03-15 LAB — POCT URINE PREGNANCY: PREG TEST UR: NEGATIVE

## 2017-03-15 MED ORDER — CEFTRIAXONE SODIUM 250 MG IJ SOLR
250.0000 mg | Freq: Once | INTRAMUSCULAR | Status: AC
  Administered 2017-03-15: 250 mg via INTRAMUSCULAR

## 2017-03-15 MED ORDER — DOXYCYCLINE HYCLATE 100 MG PO TABS
100.0000 mg | ORAL_TABLET | Freq: Two times a day (BID) | ORAL | 0 refills | Status: DC
Start: 2017-03-15 — End: 2017-03-16

## 2017-03-15 MED ORDER — METRONIDAZOLE 500 MG PO TABS: 500.0000 mg | ORAL_TABLET | Freq: Two times a day (BID) | ORAL | 0 refills | Status: DC

## 2017-03-15 NOTE — Telephone Encounter (Signed)
There are no alternatives to the medications that were given. Please call the patient and explain this

## 2017-03-15 NOTE — Telephone Encounter (Signed)
Pt contacted- per pt her combined prescriptions were $100. She was prescribed flagyl and doxycycline. I informed pt the flagyl should be cheaper and the doxycycline is on the 4-10$ list at [facility]. Pt is to call to confirm if the doxycyline is the more expensive rx. Pt will then get her rx sent into [facility].

## 2017-03-15 NOTE — Progress Notes (Signed)
   Subjective:    Patient ID: Kathleen Alexander, female    DOB: Mar 30, 1996, 21 y.o.   MRN: [id number]   CC: abdominal pain  HPI: 21 y/o F presents for abdominal pain   Abdominal pain - present for abdominal pain that has been presents for the last 2 weeks, 23 after her menses started. - she is sexually active with one female partner - she does not always use condoms - she is not on birth control and is not interested in starting - she denies dysuria but does state that her abdominal pain is worse when she urinates - denies fevers, diarrhea, last BM was yesterday - denies vaginal irritation or discharge   Smoking status reviewed  Review of Systems  Per HPI   Objective:  BP 100/70   Pulse 93   Temp 99.1 F (37.3 C) (Oral)   Wt 137 lb (62.1 kg)   LMP 02/23/2017   SpO2 99%   BMI 22.80 kg/m  Vitals and nursing note reviewed  General: NAD Cardiac: RRR, Respiratory: CTAB, normal effort Abdomen: soft, mildly tender suprapubically no rebound or guarding, nondistended,  Skin: warm and dry, no rashes noted  Pelvic: Normal EGBUS, normal vaginal canal but with gross amount of green watery discharge, normal cervix with no CMT, normal mobile uterus, normal adnexa with no masses, no adnexal tenderness     Assessment & Plan:    Abdominal pain Concern for STD given risky taking sexual behavior. No fevers or CMT on exam but amount of time she has had suprapubic abdominal pain concerning for prolonged infection. - will presumptively treat for PID with IM cefrtiaxone in clinic and doxycycline x14 days, if she does not have GC/CT will call her and tell her to stop the antibiotic - UA, wet prep performed, will treat as needed - GC/Ct today  Healthcare maintenance Pap today    [name] A. Lincoln Brigham MD, [facility] Resident PGY-3 Pager [phone]

## 2017-03-15 NOTE — Telephone Encounter (Signed)
moher called about the 2 prescriptions that were given to her daughter today are too expensive.  She needs something else. Please advise

## 2017-03-15 NOTE — Patient Instructions (Signed)
Follow up as needed You will be called about any abnormal results If you have questions or concerns, call the office at [phone]

## 2017-03-15 NOTE — Assessment & Plan Note (Addendum)
Concern for STD given risky taking sexual behavior. No fevers or CMT on exam but amount of time she has had suprapubic abdominal pain concerning for prolonged infection. - will presumptively treat for PID with IM cefrtiaxone in clinic and doxycycline x14 days, if she does not have GC/CT will call her and tell her to stop the antibiotic - UA, wet prep performed, will treat as needed - wet prep + for BV - GC/Ct today

## 2017-03-15 NOTE — Assessment & Plan Note (Signed)
Pap today 

## 2017-03-15 NOTE — Telephone Encounter (Signed)
Will forward to MD to advise. Jazmin Hartsell,CMA  

## 2017-03-16 ENCOUNTER — Telehealth: Payer: Self-pay | Admitting: Student

## 2017-03-16 MED ORDER — DOXYCYCLINE HYCLATE 100 MG PO TABS: 100.0000 mg | ORAL_TABLET | Freq: Two times a day (BID) | ORAL | 0 refills | Status: DC

## 2017-03-16 NOTE — Telephone Encounter (Signed)
Contacted pt again- informed pt that her doxycycline rx has been printed and placed up front at [facility] to be picked up and taken to a convenient [facility]. Pt instructed to pick up her flagy at her regular pharmacy.

## 2017-03-16 NOTE — Telephone Encounter (Signed)
Doxycycline printed and left at front. The patient was called and informed

## 2017-03-19 LAB — CYTOLOGY - PAP
Chlamydia: POSITIVE — AB
Neisseria Gonorrhea: POSITIVE — AB

## 2017-03-20 ENCOUNTER — Ambulatory Visit (INDEPENDENT_AMBULATORY_CARE_PROVIDER_SITE_OTHER): Payer: Self-pay | Admitting: *Deleted

## 2017-03-20 ENCOUNTER — Telehealth: Payer: Self-pay | Admitting: Student

## 2017-03-20 DIAGNOSIS — Z202 Contact with and (suspected) exposure to infections with a predominantly sexual mode of transmission: Secondary | ICD-10-CM

## 2017-03-20 MED ORDER — AZITHROMYCIN 500 MG PO TABS
1000.0000 mg | ORAL_TABLET | Freq: Once | ORAL | Status: AC
  Administered 2017-03-20: 1000 mg via ORAL

## 2017-03-20 NOTE — Telephone Encounter (Signed)
Pt has been put on the nurse schedule for STD treatment.

## 2017-03-20 NOTE — Progress Notes (Signed)
    Patient in nurse clinic for std treatment.  Patient was positive for gonorrhea and chlamydia recently.  However she was only given Rocephin 250 mg IM in clinic.  Patient was given Azithromycin 1 gm PO x 1 today per Dr. Etta Grandchild order.  Appointment made for [place] [facility] 04/13/2017 at 11:30 for abnormal pap and 03/22/17 follow up with Dr. Lincoln Brigham.  Patient offered condoms but declined.  Advised patient not to have sex with partner until he is treated/tested.  Derl Barrow, RN

## 2017-03-20 NOTE — Telephone Encounter (Signed)
Did not mean to route to PCP.

## 2017-03-20 NOTE — Telephone Encounter (Signed)
Patient was called as she is + for both chlamydia aqnd gonorrhea. She was given rocephin in the office at her last visit last week but has not picked up the doxycycline Rx. She states she is coming to the office at this time. I would prefer for her to get azithromycin so we have documented that she has completed GC/Ct treatment. She also had an LSIL pap. She will need colpo. PLease make an appointment for colpo clinic for her when she arrives

## 2017-03-22 ENCOUNTER — Ambulatory Visit (INDEPENDENT_AMBULATORY_CARE_PROVIDER_SITE_OTHER): Payer: Medicaid Other | Admitting: Student

## 2017-03-22 ENCOUNTER — Encounter: Payer: Self-pay | Admitting: Student

## 2017-03-22 DIAGNOSIS — A549 Gonococcal infection, unspecified: Secondary | ICD-10-CM

## 2017-03-22 DIAGNOSIS — Z8619 Personal history of other infectious and parasitic diseases: Secondary | ICD-10-CM | POA: Insufficient documentation

## 2017-03-22 DIAGNOSIS — Z0000 Encounter for general adult medical examination without abnormal findings: Secondary | ICD-10-CM

## 2017-03-22 DIAGNOSIS — A749 Chlamydial infection, unspecified: Secondary | ICD-10-CM

## 2017-03-22 DIAGNOSIS — Z309 Encounter for contraceptive management, unspecified: Secondary | ICD-10-CM | POA: Insufficient documentation

## 2017-03-22 HISTORY — DX: Personal history of other infectious and parasitic diseases: Z86.19

## 2017-03-22 NOTE — Progress Notes (Signed)
   Subjective:    Patient ID: Kathleen Alexander, female    DOB: February 25, 1996, 21 y.o.   MRN: [id number]   CC: follow up + GC/CT, abnormal pap  HPI: 21 y/o presents for follow up + GC/CT, abd pap  + GC/CT -Seen last week for abd pain and found to have + GC/CT - treated with azithromycin and rocephin in clinic - abdominal pain has resolved, no fevers or vaginal complaints - she had not had intercourse with her partner again but she is not sure if he has been tested   LSIL/HPV+ pap - Colpo scheduled   Contraception - does not use contraception - uses condoms irregularly  Smoking status reviewed  Review of Systems  Per HPI    Objective:  BP 100/70   Pulse 89   Temp 98.4 F (36.9 C) (Oral)   Wt 142 lb (64.4 kg)   LMP 02/23/2017   SpO2 99%   BMI 23.63 kg/m  Vitals and nursing note reviewed  General: NAD Cardiac: RRR,  Respiratory: CTAB, normal effort Abdomen: soft, nontender Skin: warm and dry, no rashes noted Neuro: alert and oriented, no focal deficits   Assessment & Plan:    Healthcare maintenance Pap LSIL/HPV+, colposcopy scheduled  Gonorrhea + gonorrhea and chlamydia, s/p treatment for PID. symtpoms now improved - discussed need to not have sex for at least 7 days after both she and her partner have been treated - counseled to always use a condom  Contraceptive management Declined contraception at the time. Contraception counseling was provided and a hand out was given - emergency contraception counseling was given    Casara Perrier A. Lincoln Brigham MD, [facility] Resident PGY-3 Pager [phone]

## 2017-03-22 NOTE — Patient Instructions (Signed)
Follow up for colpscopy as scheduled USE CONDOMS WITH EVERY SEXUAL ENCOUNTER Do NOT have intercourse with your partner until 7 days after your treatment and 7 days after his treatment if needed You were given a contraception handout, please consider these options If you do have unprotected intercourse or the condoms breaks get EMERGENCY CONTRACEPTION form any pharmacy within 72 hours

## 2017-03-22 NOTE — Assessment & Plan Note (Signed)
Declined contraception at the time. Contraception counseling was provided and a hand out was given - emergency contraception counseling was given

## 2017-03-22 NOTE — Assessment & Plan Note (Signed)
Pap LSIL/HPV+, colposcopy scheduled

## 2017-03-22 NOTE — Assessment & Plan Note (Signed)
+   gonorrhea and chlamydia, s/p treatment for PID. symtpoms now improved - discussed need to not have sex for at least 7 days after both she and her partner have been treated - counseled to always use a condom

## 2017-04-13 ENCOUNTER — Ambulatory Visit: Payer: Medicaid Other

## 2017-06-10 ENCOUNTER — Encounter (HOSPITAL_COMMUNITY): Payer: Self-pay | Admitting: Emergency Medicine

## 2017-06-10 ENCOUNTER — Emergency Department (HOSPITAL_COMMUNITY): Payer: Medicaid Other

## 2017-06-10 ENCOUNTER — Emergency Department (HOSPITAL_COMMUNITY)
Admission: EM | Admit: 2017-06-10 | Discharge: 2017-06-10 | Disposition: A | Discharge status: Home / Self Care | Payer: Medicaid Other | Attending: Emergency Medicine | Admitting: Emergency Medicine

## 2017-06-10 DIAGNOSIS — J069 Acute upper respiratory infection, unspecified: Secondary | ICD-10-CM | POA: Insufficient documentation

## 2017-06-10 DIAGNOSIS — R52 Pain, unspecified: Secondary | ICD-10-CM

## 2017-06-10 DIAGNOSIS — B9789 Other viral agents as the cause of diseases classified elsewhere: Secondary | ICD-10-CM

## 2017-06-10 DIAGNOSIS — B349 Viral infection, unspecified: Secondary | ICD-10-CM | POA: Insufficient documentation

## 2017-06-10 LAB — URINALYSIS, ROUTINE W REFLEX MICROSCOPIC
BILIRUBIN URINE: NEGATIVE
Glucose, UA: NEGATIVE mg/dL
KETONES UR: 80 mg/dL — AB
LEUKOCYTES UA: NEGATIVE
Nitrite: NEGATIVE
PROTEIN: 100 mg/dL — AB
Specific Gravity, Urine: 1.029 (ref 1.005–1.030)
pH: 5 (ref 5.0–8.0)

## 2017-06-10 LAB — CBC WITH DIFFERENTIAL/PLATELET
BASOS ABS: 0 10*3/uL (ref 0.0–0.1)
Basophils Relative: 0 %
EOS PCT: 0 %
Eosinophils Absolute: 0 10*3/uL (ref 0.0–0.7)
HEMATOCRIT: 36.3 % (ref 36.0–46.0)
Hemoglobin: 11.9 g/dL — ABNORMAL LOW (ref 12.0–15.0)
LYMPHS ABS: 0.7 10*3/uL (ref 0.7–4.0)
LYMPHS PCT: 6 %
MCH: 31.4 pg (ref 26.0–34.0)
MCHC: 32.8 g/dL (ref 30.0–36.0)
MCV: 95.8 fL (ref 78.0–100.0)
Monocytes Absolute: 1 10*3/uL (ref 0.1–1.0)
Monocytes Relative: 8 %
NEUTROS ABS: 11.2 10*3/uL — AB (ref 1.7–7.7)
Neutrophils Relative %: 86 %
PLATELETS: 165 10*3/uL (ref 150–400)
RBC: 3.79 MIL/uL — AB (ref 3.87–5.11)
RDW: 13.3 % (ref 11.5–15.5)
WBC: 12.9 10*3/uL — ABNORMAL HIGH (ref 4.0–10.5)

## 2017-06-10 LAB — COMPREHENSIVE METABOLIC PANEL
ALBUMIN: 3.9 g/dL (ref 3.5–5.0)
ALT: 15 U/L (ref 14–54)
ANION GAP: 10 (ref 5–15)
AST: 22 U/L (ref 15–41)
Alkaline Phosphatase: 47 U/L (ref 38–126)
BUN: 10 mg/dL (ref 6–20)
CHLORIDE: 99 mmol/L — AB (ref 101–111)
CO2: 23 mmol/L (ref 22–32)
Calcium: 9.2 mg/dL (ref 8.9–10.3)
Creatinine, Ser: 1.05 mg/dL — ABNORMAL HIGH (ref 0.44–1.00)
GFR calc Af Amer: >60 mL/min (ref >60)
Glucose, Bld: 115 mg/dL — ABNORMAL HIGH (ref 65–99)
POTASSIUM: 3.8 mmol/L (ref 3.5–5.1)
Sodium: 132 mmol/L — ABNORMAL LOW (ref 135–145)
Total Bilirubin: 1.2 mg/dL (ref 0.3–1.2)
Total Protein: 8.2 g/dL — ABNORMAL HIGH (ref 6.5–8.1)

## 2017-06-10 LAB — RAPID STREP SCREEN (MED CTR MEBANE ONLY): STREPTOCOCCUS, GROUP A SCREEN (DIRECT): NEGATIVE

## 2017-06-10 LAB — I-STAT CG4 LACTIC ACID, ED
LACTIC ACID, VENOUS: 1.38 mmol/L (ref 0.5–1.9)
LACTIC ACID, VENOUS: 0.87 mmol/L (ref 0.5–1.9)

## 2017-06-10 MED ORDER — SODIUM CHLORIDE 0.9 % IV BOLUS (SEPSIS)
1000.0000 mL | Freq: Once | INTRAVENOUS | Status: AC
  Administered 2017-06-10: 1000 mL via INTRAVENOUS

## 2017-06-10 MED ORDER — KETOROLAC TROMETHAMINE 30 MG/ML IJ SOLN
30.0000 mg | Freq: Once | INTRAMUSCULAR | Status: AC
  Administered 2017-06-10: 30 mg via INTRAVENOUS
  Filled 2017-06-10: qty 1

## 2017-06-10 MED ORDER — ACETAMINOPHEN 325 MG PO TABS
ORAL_TABLET | ORAL | Status: AC
  Filled 2017-06-10: qty 2

## 2017-06-10 MED ORDER — ACETAMINOPHEN 325 MG PO TABS
650.0000 mg | ORAL_TABLET | Freq: Once | ORAL | Status: AC | PRN
  Administered 2017-06-10: 650 mg via ORAL

## 2017-06-10 NOTE — ED Provider Notes (Signed)
[facility] DEPT Provider Note   CSN: [id number] Arrival date & time: 06/10/17  0507     History   Chief Complaint Chief Complaint  Patient presents with  . Generalized Body Aches    HPI Kathleen Alexander is a 21 y.o. female.  The history is provided by the patient and medical records.     21 year old female here with Generalized Bodyaches URI Type Symptoms over the past 3 Days. States Specifically She has had generalized muscular aching, sore throat, dry cough and some nasal congestion. States is very painful when she swallows. States she has not been eating and drinking as much as normal because of this. She's not had any nausea or vomiting. She denies any chest pain or shortness of breath. States she has had fever at home. She denies any sick contacts. Does report history of strep throat in the past. Has not tried taking any medications for her symptoms. She was given Tylenol on arrival here but states she still feels very achy.  History reviewed. No pertinent past medical history.  Patient Active Problem List   Diagnosis Date Noted  . Gonorrhea 03/22/2017  . Chlamydia 03/22/2017  . Contraceptive management 03/22/2017  . Healthcare maintenance 03/15/2017  . Vaginitis and vulvovaginitis 03/20/2014  . VISUAL ACUITY, DECREASED 10/06/2008  . DEFICIENCY, NUTRITIONAL NOS 10/01/2007  . SCAR/FIBROSIS, SKIN 10/01/2007  . ACNE 02/22/2007    History reviewed. No pertinent surgical history.  OB History    No data available       Home Medications    Prior to Admission medications   Medication Sig Start Date End Date Taking? Authorizing Provider  albuterol (PROVENTIL HFA;VENTOLIN HFA) 108 (90 BASE) MCG/ACT inhaler Inhale 2 puffs into the lungs every 4 (four) hours as needed for wheezing or shortness of breath. 12/21/13   Harden Mo, MD  beclomethasone (QVAR) 80 MCG/ACT inhaler Inhale 2 puffs into the lungs 2 (two) times daily. 12/21/13   Harden Mo, MD    cephALEXin (KEFLEX) 500 MG capsule Take 1 capsule (500 mg total) by mouth 2 (two) times daily. 10/21/16   Rumley, Burna Cash, DO  doxycycline (VIBRA-TABS) 100 MG tablet Take 1 tablet (100 mg total) by mouth 2 (two) times daily. 03/16/17   Veatrice Bourbon, MD  ibuprofen (ADVIL,MOTRIN) 800 MG tablet Take 1 tablet (800 mg total) by mouth every 8 (eight) hours as needed. 12/21/16   Mayo, Pete Pelt, MD  metroNIDAZOLE (FLAGYL) 500 MG tablet Take 1 tablet (500 mg total) by mouth 2 (two) times daily. For 5 days. Do not drink alcohol while taking this medication. 10/21/16   Rumley, Shongopovi N, DO  metroNIDAZOLE (FLAGYL) 500 MG tablet Take 1 tablet (500 mg total) by mouth 2 (two) times daily. 03/15/17   Haney, Yetta Flock A, MD  ondansetron (ZOFRAN ODT) 4 MG disintegrating tablet Take 1 tablet (4 mg total) by mouth every 8 (eight) hours as needed for nausea or vomiting. 12/21/16   Mayo, Pete Pelt, MD  polyethylene glycol powder (GLYCOLAX/MIRALAX) powder Take 8.5 g by mouth daily. 04/29/16   Leone Brand, MD  PROVENTIL HFA 108 [id number] BASE) MCG/ACT inhaler USE 2 PUFFS 1/2 HOUR BEFORE ACTIVITY 05/16/13   McDiarmid, Blane Ohara, MD  sulfamethoxazole-trimethoprim (BACTRIM) 400-80 MG tablet Take 1 tablet by mouth 2 (two) times daily. 06/20/16   Leeanne Rio, MD  triamcinolone cream (KENALOG) 0.1 % Apply 1 application topically 3 (three) times daily. 12/21/13   Harden Mo, MD    Family  History No family history on file.  Social History Social History  Substance Use Topics  . Smoking status: Never Smoker  . Smokeless tobacco: Never Used  . Alcohol use No     Allergies   Patient has no known allergies.   Review of Systems Review of Systems  HENT: Positive for congestion and sore throat.   Respiratory: Positive for cough.   Musculoskeletal: Positive for myalgias.  All other systems reviewed and are negative.    Physical Exam Updated Vital Signs BP 100/70   Pulse 88   Temp (!) 102 F (38.9 C)  (Oral)   Resp 16   Ht 5\' 5"  (1.651 m)   Wt 64.9 kg (143 lb)   SpO2 95%   BMI 23.80 kg/m   Physical Exam  Constitutional: She is oriented to person, place, and time. She appears well-developed and well-nourished.  HENT:  Head: Normocephalic and atraumatic.  Right Ear: Tympanic membrane and ear canal normal.  Left Ear: Tympanic membrane and ear canal normal.  Nose: Mucosal edema present.  Mouth/Throat: Uvula is midline and mucous membranes are normal. Posterior oropharyngeal erythema present. No oropharyngeal exudate, posterior oropharyngeal edema or tonsillar abscesses.  Tonsils 1+ bilaterally without exudates, erythema noted; uvula midline without evidence of peritonsillar abscess; handling secretions appropriately; no difficulty swallowing or speaking; normal phonation without stridor  Eyes: Conjunctivae and EOM are normal. Pupils are equal, round, and reactive to light.  Neck: Normal range of motion.  Cardiovascular: Normal rate, regular rhythm and normal heart sounds.   Pulmonary/Chest: Effort normal and breath sounds normal. No respiratory distress. She has no wheezes.  Abdominal: Soft. Bowel sounds are normal. There is no tenderness. There is no rebound.  Musculoskeletal: Normal range of motion.  Neurological: She is alert and oriented to person, place, and time.  Skin: Skin is warm and dry.  Psychiatric: She has a normal mood and affect.  Nursing note and vitals reviewed.    ED Treatments / Results  Labs (all labs ordered are listed, but only abnormal results are displayed) Labs Reviewed  COMPREHENSIVE METABOLIC PANEL - Abnormal; Notable for the following:       Result Value   Sodium 132 (*)    Chloride 99 (*)    Glucose, Bld 115 (*)    Creatinine, Ser 1.05 (*)    Total Protein 8.2 (*)    All other components within normal limits  CBC WITH DIFFERENTIAL/PLATELET - Abnormal; Notable for the following:    WBC 12.9 (*)    RBC 3.79 (*)    Hemoglobin 11.9 (*)    Neutro  Abs 11.2 (*)    All other components within normal limits  URINALYSIS, ROUTINE W REFLEX MICROSCOPIC - Abnormal; Notable for the following:    APPearance HAZY (*)    Hgb urine dipstick SMALL (*)    Ketones, ur 80 (*)    Protein, ur 100 (*)    Bacteria, UA FEW (*)    Squamous Epithelial / LPF 6-30 (*)    All other components within normal limits  RAPID STREP SCREEN (NOT AT [facility])  CULTURE, GROUP A STREP ([place])  I-STAT CG4 LACTIC ACID, ED    EKG  EKG Interpretation None       Radiology Dg Chest 2 View  Result Date: 06/10/2017 CLINICAL DATA:  Chest ache. EXAM: CHEST  2 VIEW COMPARISON:  None FINDINGS: The heart size and mediastinal contours are within normal limits. Both lungs are clear. The visualized skeletal structures are unremarkable. IMPRESSION:  No active cardiopulmonary disease. Electronically Signed   By: Kerby Moors M.D.   On: 06/10/2017 07:20    Procedures Procedures (including critical care time)  Medications Ordered in ED Medications  acetaminophen (TYLENOL) 325 MG tablet (not administered)  acetaminophen (TYLENOL) tablet 650 mg (650 mg Oral Given 06/10/17 0532)  sodium chloride 0.9 % bolus 1,000 mL (0 mLs Intravenous Stopped 06/10/17 0930)  ketorolac (TORADOL) 30 MG/ML injection 30 mg (30 mg Intravenous Given 06/10/17 0831)     Initial Impression / Assessment and Plan / ED Course  I have reviewed the triage vital signs and the nursing notes.  Pertinent labs & imaging results that were available during my care of the patient were reviewed by me and considered in my medical decision making (see chart for details).  21 year old female here with generalized body aches and URI type symptoms in the past 3 days. She denies any sick contacts. Notably he has had cough, sore throat, congestion, and fever. Show a low-grade fever on arrival here but nontoxic in appearance. Tonsils are mildly swollen erythematous but there are no exudates. No evidence of peritonsillar abscess.  She is handling her secretions well. Lab work overall reassuring. Does have some ketones in the urine which I suspect is secondary to dehydration. Rapid strep was sent and is negative, culture pending. Chest x-ray is clear. Patient was treated here with IV fluids and Toradol, states she is feeling better at this time. Constellation of symptoms likely viral in nature. Will plan to discharge home with continued supportive care.  Can follow-up with PCP if any ongoing issues.  Discussed plan with patient, she acknowledged understanding and agreed with plan of care.  Return precautions given for new or worsening symptoms.  Final Clinical Impressions(s) / ED Diagnoses   Final diagnoses:  Viral URI with cough  Body aches    New Prescriptions New Prescriptions   No medications on file     Larene Pickett, PA-C 06/10/17 1007    Charlesetta Shanks, MD 06/11/17 1606

## 2017-06-10 NOTE — ED Notes (Signed)
Pt tolerating water, denies nausea

## 2017-06-10 NOTE — ED Triage Notes (Signed)
Pt reports she has been feeling poorly w/ generalized body aches, headaches and fever.  Pt took advil at 1am.  Pt reports she has not been able to keep any food down as well.

## 2017-06-10 NOTE — Discharge Instructions (Signed)
As we discussed, your labs, strep test, and chest x-ray today were reassuring.  We feel your symptoms are likely viral in nature. Recommend to rest and drink fluids. Follow-up with your primary care doctor if you have any ongoing issues. Return here for any new/worsening symptoms.

## 2017-06-10 NOTE — ED Notes (Signed)
RN Anderson Malta informed of pt temp 102

## 2017-06-12 ENCOUNTER — Other Ambulatory Visit: Payer: Self-pay | Admitting: Family Medicine

## 2017-06-12 ENCOUNTER — Encounter: Payer: Self-pay | Admitting: Family Medicine

## 2017-06-12 ENCOUNTER — Encounter (HOSPITAL_COMMUNITY): Payer: Self-pay | Admitting: Emergency Medicine

## 2017-06-12 ENCOUNTER — Ambulatory Visit (INDEPENDENT_AMBULATORY_CARE_PROVIDER_SITE_OTHER): Payer: Self-pay | Admitting: Family Medicine

## 2017-06-12 ENCOUNTER — Emergency Department (HOSPITAL_COMMUNITY)
Admission: EM | Admit: 2017-06-12 | Discharge: 2017-06-12 | Disposition: A | Discharge status: Home / Self Care | Payer: Self-pay | Attending: Emergency Medicine | Admitting: Emergency Medicine

## 2017-06-12 ENCOUNTER — Emergency Department (HOSPITAL_COMMUNITY): Payer: Self-pay

## 2017-06-12 VITALS — BP 110/66 | HR 73 | Temp 98.5°F | Ht 65.0 in | Wt 138.6 lb

## 2017-06-12 DIAGNOSIS — B9789 Other viral agents as the cause of diseases classified elsewhere: Secondary | ICD-10-CM

## 2017-06-12 DIAGNOSIS — J029 Acute pharyngitis, unspecified: Secondary | ICD-10-CM | POA: Insufficient documentation

## 2017-06-12 DIAGNOSIS — N644 Mastodynia: Secondary | ICD-10-CM

## 2017-06-12 DIAGNOSIS — Z79899 Other long term (current) drug therapy: Secondary | ICD-10-CM | POA: Insufficient documentation

## 2017-06-12 DIAGNOSIS — B349 Viral infection, unspecified: Secondary | ICD-10-CM | POA: Insufficient documentation

## 2017-06-12 DIAGNOSIS — N6310 Unspecified lump in the right breast, unspecified quadrant: Secondary | ICD-10-CM

## 2017-06-12 DIAGNOSIS — J028 Acute pharyngitis due to other specified organisms: Secondary | ICD-10-CM

## 2017-06-12 HISTORY — DX: Mastodynia: N64.4

## 2017-06-12 LAB — CULTURE, GROUP A STREP (THRC)

## 2017-06-12 MED ORDER — IBUPROFEN 600 MG PO TABS: 600.0000 mg | ORAL_TABLET | Freq: Four times a day (QID) | ORAL | 0 refills | Status: DC | PRN

## 2017-06-12 MED ORDER — MAGIC MOUTHWASH W/LIDOCAINE: 5.0000 mL | Freq: Three times a day (TID) | ORAL | 0 refills | Status: DC | PRN

## 2017-06-12 MED ORDER — DEXAMETHASONE SODIUM PHOSPHATE 10 MG/ML IJ SOLN
10.0000 mg | Freq: Once | INTRAMUSCULAR | Status: AC
  Administered 2017-06-12: 10 mg via INTRAVENOUS
  Filled 2017-06-12: qty 1

## 2017-06-12 MED ORDER — LACTATED RINGERS IV BOLUS (SEPSIS)
1000.0000 mL | Freq: Once | INTRAVENOUS | Status: AC
  Administered 2017-06-12: 1000 mL via INTRAVENOUS

## 2017-06-12 MED ORDER — KETOROLAC TROMETHAMINE 15 MG/ML IJ SOLN
15.0000 mg | Freq: Once | INTRAMUSCULAR | Status: AC
  Administered 2017-06-12: 15 mg via INTRAVENOUS
  Filled 2017-06-12: qty 1

## 2017-06-12 NOTE — Patient Instructions (Addendum)
I have ordered an ultrasound of your breast. They will need to do both so they can get a baseline to compare to the right breast.  Our insurance specialist is not here currently, we will touch base to make sure your insurance covers this prior to your appointment and contact you one way or the other.  I do think this is most likely benign (NOT cancer) and related to your period, but it is odd to be only in 1 breast.    Breast Tenderness Breast tenderness is a common problem for women of all ages. Breast tenderness may cause mild discomfort to severe pain. The pain usually comes and goes in association with your menstrual cycle, but it can be constant. Breast tenderness has many possible causes, including hormone changes and some medicines. Your health care provider may order tests, such as a mammogram or an ultrasound, to check for any unusual findings. Having breast tenderness usually does not mean that you have breast cancer. Follow these instructions at home: Sometimes, reassurance that you do not have breast cancer is all that is needed. In general, follow these home care instructions: Managing pain and discomfort  If directed, apply ice to the area: ? Put ice in a plastic bag. ? Place a towel between your skin and the bag. ? Leave the ice on for 20 minutes, 2-3 times a day.  Make sure you are wearing a supportive bra, especially during exercise. You may also want to wear a supportive bra while sleeping if your breasts are very tender. Medicines  Take over-the-counter and prescription medicines only as told by your health care provider. If the cause of your pain is infection, you may be prescribed an antibiotic medicine.  If you were prescribed an antibiotic, take it as told by your health care provider. Do not stop taking the antibiotic even if you start to feel better. General instructions  Your health care provider may recommend that you reduce the amount of fat in your diet. You  can do this by: ? Limiting fried foods. ? Cooking foods using methods, such as baking, boiling, grilling, and broiling.  Decrease the amount of caffeine in your diet. You can do this by drinking more water and choosing caffeine-free options.  Keep a log of the days and times when your breasts are most tender.  Ask your health care provider how to do breast exams at home. This will help you notice if you have an unusual growth or lump. Contact a health care provider if:  Any part of your breast is hard, red, and hot to the touch. This may be a sign of infection.  You are not breastfeeding and you have fluid, especially blood or pus, coming out of your nipples.  You have a fever.  You have a new or painful lump in your breast that remains after your menstrual period ends.  Your pain does not improve or it gets worse.  Your pain is interfering with your daily activities. This information is not intended to replace advice given to you by your health care provider. Make sure you discuss any questions you have with your health care provider. Document Released: 11/24/2008 Document Revised: 09/09/2016 Document Reviewed: 09/09/2016 Elsevier Interactive Patient Education  [employer].

## 2017-06-12 NOTE — Discharge Instructions (Signed)
Take the motrin as prescribed. Return to the ER if you have difficulty breathing or swallowing. Sometimes viral infection can take upto 2 weeks to get better. Hydrate well.

## 2017-06-12 NOTE — Assessment & Plan Note (Addendum)
Patient presenting with unilateral breast pain and a nodular / fibrous components at the 10:00 position of the right breast. Her menses began yesterday. Common things being common, this is most likely fibroystic breast disease, but the fact that it is not bilateral and it has not began to resolve on the onset of menses warrants further investigation. Unfortunately, patient has family planning Medicaid only. -Ordered ultrasound of the breast; already scheduled -We'll touch base with the insurance coordinator when she is in the office tomorrow about coverage for this. -- patient would like for [place] to contact her mom, Levada Dy: [phone] with any updates.

## 2017-06-12 NOTE — Progress Notes (Signed)
    Subjective: CC: breast pain HPI: Patient is a 21 y.o. female  presenting to clinic today for pain in her R breast.  Breast pain: Right outer portion x  Only painful to touch, not constant.  Can't describe pain. Present X 2-3  No nipple drinage, no change in skin overlying the breast.  No weight loss or night sweats.  Denies pain anywhere else in the R breast and none in the left breast. Feels the texture of that area may be different, may have a lump  LMP 06/11/17 (yesterday), states pain is worsening and not improving. Never had breast pain like this before.  Hasn't tried anything for the pain.   No family h/o breast cancer.  Social History: never smoker   ROS: All other systems reviewed and are negative.  Past Medical History Patient Active Problem List   Diagnosis Date Noted  . Breast pain 06/12/2017  . Gonorrhea 03/22/2017  . Chlamydia 03/22/2017  . Contraceptive management 03/22/2017  . Healthcare maintenance 03/15/2017  . Vaginitis and vulvovaginitis 03/20/2014  . VISUAL ACUITY, DECREASED 10/06/2008  . DEFICIENCY, NUTRITIONAL NOS 10/01/2007  . SCAR/FIBROSIS, SKIN 10/01/2007  . ACNE 02/22/2007    Medications- reviewed and updated Current Outpatient Prescriptions  Medication Sig Dispense Refill  . ibuprofen (ADVIL,MOTRIN) 600 MG tablet Take 1 tablet (600 mg total) by mouth every 6 (six) hours as needed. 30 tablet 0  . magic mouthwash w/lidocaine SOLN Take 5 mLs by mouth 3 (three) times daily as needed for mouth pain. 45 mL 0   No current facility-administered medications for this visit.     Objective: Office vital signs reviewed. BP 110/66   Pulse 73   Temp 98.5 F (36.9 C) (Oral)   Ht 5\' 5"  (1.651 m)   Wt 138 lb 9.6 oz (62.9 kg)   LMP 06/11/2017 (Exact Date)   SpO2 99%   BMI 23.06 kg/m    Physical Examination:  General: Awake, alert, well- nourished, NAD Breast exam performed with chaperone. left breast normal without mass, skin or nipple  changes or axillary nodes, fibrous change noted in the right outer /upper breast, tender to palpation. No skin or nipple changes. No axillary nodes.   Assessment/Plan: Breast pain Patient presenting with unilateral breast pain and a nodular / fibrous components at the 10:00 position of the right breast. Her menses began yesterday. Common things being common, this is most likely fibroystic breast disease, but the fact that it is not bilateral and it has not began to resolve on the onset of menses warrants further investigation. Unfortunately, patient has family planning Medicaid only. -Ordered ultrasound of the breast; already scheduled -We'll touch base with the insurance coordinator when she is in the office tomorrow about coverage for this. -- patient would like for [place] to contact her mom, Levada Dy: [phone] with any updates.    No orders of the defined types were placed in this encounter.   No orders of the defined types were placed in this encounter.   Archie Patten PGY-3, [place]

## 2017-06-12 NOTE — ED Triage Notes (Signed)
Pt states she has body aches for 5 days with fevers and sore throat  Pt states she was seen at [facility] on Saturday and they did not do anything for her and sent her home  Pt states her throat hurts so bad she cannot eat  Pt is tearful in triage

## 2017-06-12 NOTE — ED Provider Notes (Signed)
[facility] DEPT Provider Note   CSN: [id number] Arrival date & time: 06/12/17  9476  By signing my name below, I, Kathleen Alexander, attest that this documentation has been prepared under the direction and in the presence of Varney Biles, MD. Electronically Signed: Lise Auer, ED Scribe. 06/12/17. 2:14 AM.  History   Chief Complaint Chief Complaint  Patient presents with  . Fever  . Generalized Body Aches  . Sore Throat   The history is provided by the patient and a parent. No language interpreter was used.    HPI Comments: Kathleen Alexander is a 21 y.o. female with no pertinent history who presents to the Emergency Department complaining of a persistent fever for the past five days, she rates the severity of her pain a 7/10. She notes associated body aches, chills, decreased appetite, cough, weakness, fatigue, chills, and sore throat. Pt was seen in the ED three days ago with the same symptoms with no relief of her symptoms after being discharged. States it is very painful when she swallows. She has been drinking but not eating for the past two days. Denies sick contact. She has tried intermittent Tylenol with no significant improvement.   History reviewed. No pertinent past medical history.  Patient Active Problem List   Diagnosis Date Noted  . Gonorrhea 03/22/2017  . Chlamydia 03/22/2017  . Contraceptive management 03/22/2017  . Healthcare maintenance 03/15/2017  . Vaginitis and vulvovaginitis 03/20/2014  . VISUAL ACUITY, DECREASED 10/06/2008  . DEFICIENCY, NUTRITIONAL NOS 10/01/2007  . SCAR/FIBROSIS, SKIN 10/01/2007  . ACNE 02/22/2007   History reviewed. No pertinent surgical history.  OB History    No data available      Home Medications    Prior to Admission medications   Medication Sig Start Date End Date Taking? Authorizing Provider  ibuprofen (ADVIL,MOTRIN) 600 MG tablet Take 1 tablet (600 mg total) by mouth every 6 (six) hours as needed. 06/12/17   Varney Biles, MD  magic mouthwash w/lidocaine SOLN Take 5 mLs by mouth 3 (three) times daily as needed for mouth pain. 06/12/17   Varney Biles, MD   Family History Family History  Problem Relation Age of Onset  . Hypertension Other   . CAD Other    Social History Social History  Substance Use Topics  . Smoking status: Never Smoker  . Smokeless tobacco: Never Used  . Alcohol use No    Allergies   Patient has no known allergies.  Review of Systems Review of Systems  Constitutional: Positive for appetite change, chills, fatigue and fever.  HENT: Positive for sore throat.   Respiratory: Positive for cough.   Musculoskeletal: Positive for myalgias.  Neurological: Positive for weakness.  All other systems reviewed and are negative.  Physical Exam Updated Vital Signs BP 104/65 (BP Location: Left Arm)   Pulse 75   Temp 98.9 F (37.2 C) (Oral)   Resp 20   LMP 06/11/2017 (Exact Date)   SpO2 99%   Physical Exam  Constitutional: She is oriented to person, place, and time. She appears well-developed and well-nourished.  HENT:  Head: Normocephalic.  Mouth/Throat: Oropharyngeal exudate present.  Left side tonsillar enlargement with exudate. Bilateral cervical lymphopathy.   Eyes: EOM are normal.  Neck: Normal range of motion. Neck supple. No JVD present.  Pulmonary/Chest: Effort normal.  Abdominal: She exhibits no distension and no mass.  Musculoskeletal: Normal range of motion.  Lymphadenopathy:    She has cervical adenopathy.  Neurological: She is alert and oriented to person,  place, and time.  Psychiatric: She has a normal mood and affect.  Nursing note and vitals reviewed.  ED Treatments / Results  DIAGNOSTIC STUDIES: Oxygen Saturation is 100% on RA, normal by my interpretation.   COORDINATION OF CARE: 1:56 AM-Discussed next steps with pt. Pt verbalized understanding and is agreeable with the plan.   Labs (all labs ordered are listed, but only abnormal results are  displayed) Labs Reviewed - No data to display  EKG  EKG Interpretation None       Radiology Dg Chest 2 View  Result Date: 06/12/2017 CLINICAL DATA:  Generalized body aches and cough. EXAM: CHEST  2 VIEW COMPARISON:  Chest radiograph 06/10/2017 FINDINGS: The heart size and mediastinal contours are within normal limits. Both lungs are clear. The visualized skeletal structures are unremarkable. IMPRESSION: No active cardiopulmonary disease. Electronically Signed   By: Ulyses Jarred M.D.   On: 06/12/2017 01:45   Dg Chest 2 View  Result Date: 06/10/2017 CLINICAL DATA:  Chest ache. EXAM: CHEST  2 VIEW COMPARISON:  None FINDINGS: The heart size and mediastinal contours are within normal limits. Both lungs are clear. The visualized skeletal structures are unremarkable. IMPRESSION: No active cardiopulmonary disease. Electronically Signed   By: Kerby Moors M.D.   On: 06/10/2017 07:20    Procedures Procedures (including critical care time)  Medications Ordered in ED Medications  lactated ringers bolus 1,000 mL (1,000 mLs Intravenous New Bag/Given 06/12/17 0216)  ketorolac (TORADOL) 15 MG/ML injection 15 mg (15 mg Intravenous Given 06/12/17 0216)  dexamethasone (DECADRON) injection 10 mg (10 mg Intravenous Given 06/12/17 0221)     Initial Impression / Assessment and Plan / ED Course  I have reviewed the triage vital signs and the nursing notes.  Pertinent labs & imaging results that were available during my care of the patient were reviewed by me and considered in my medical decision making (see chart for details).     Pt comes in with cc of sore throat and other constitutional. It seems like pt has a viral URI with pharyngitis. DDX also includes mono, strep, tonsillitis, parapharyngeal abscess, peritonsillar abscess and retropharyngeal abscess. Pt has no splenomegaly. Pt has no drooling, trismus, and she is not toxic appearing. Pt is immunocompetent. She doesn't have any deep space  infection at the moment. Labs from last visit reviewed. Results from the ER workup discussed with the patient face to face and all questions answered to the best of my ability.  We will give decadron due to limited po intake secondary to pain. We have advised round the clock motrin. With a neg strep just 2 days ago - no need for repeat strep test.  Strict ER return precautions have been discussed, and patient is agreeing with the plan and is comfortable with the workup done and the recommendations from the ER.   Final Clinical Impressions(s) / ED Diagnoses   Final diagnoses:  Viral syndrome  Acute viral pharyngitis    New Prescriptions New Prescriptions   IBUPROFEN (ADVIL,MOTRIN) 600 MG TABLET    Take 1 tablet (600 mg total) by mouth every 6 (six) hours as needed.   MAGIC MOUTHWASH W/LIDOCAINE SOLN    Take 5 mLs by mouth 3 (three) times daily as needed for mouth pain.   I personally performed the services described in this documentation, which was scribed in my presence. The recorded information has been reviewed and is accurate.    Varney Biles, MD 06/12/17 [phone]

## 2017-06-13 ENCOUNTER — Telehealth: Payer: Self-pay | Admitting: Family Medicine

## 2017-06-13 NOTE — Telephone Encounter (Signed)
Family planning medicaid does not cover ultrasound. Discussed with attending, Dr. Ree Kida, who agreed while not textbook, symptoms most consistent with hormonal changes.  Will wait 2 weeks until patient is off her menses and see if patient's symptoms have resolved. If so, no further work up needed.  Discussed with pt's mother, Levada Dy, as the patient asked me to yesterday. She voiced understanding.   Breast US was cancelled with the [facility].  Archie Patten, MD [facility] Family Medicine Resident  06/13/2017, 11:51 AM

## 2017-06-20 ENCOUNTER — Other Ambulatory Visit: Payer: Medicaid Other

## 2018-06-08 IMAGING — DX DG CHEST 2V
2 series · 2 of 2 positions shown · non-contrast
Comparison: None

CLINICAL DATA: Chest ache.

EXAM:
CHEST  2 VIEW

[chest pa]
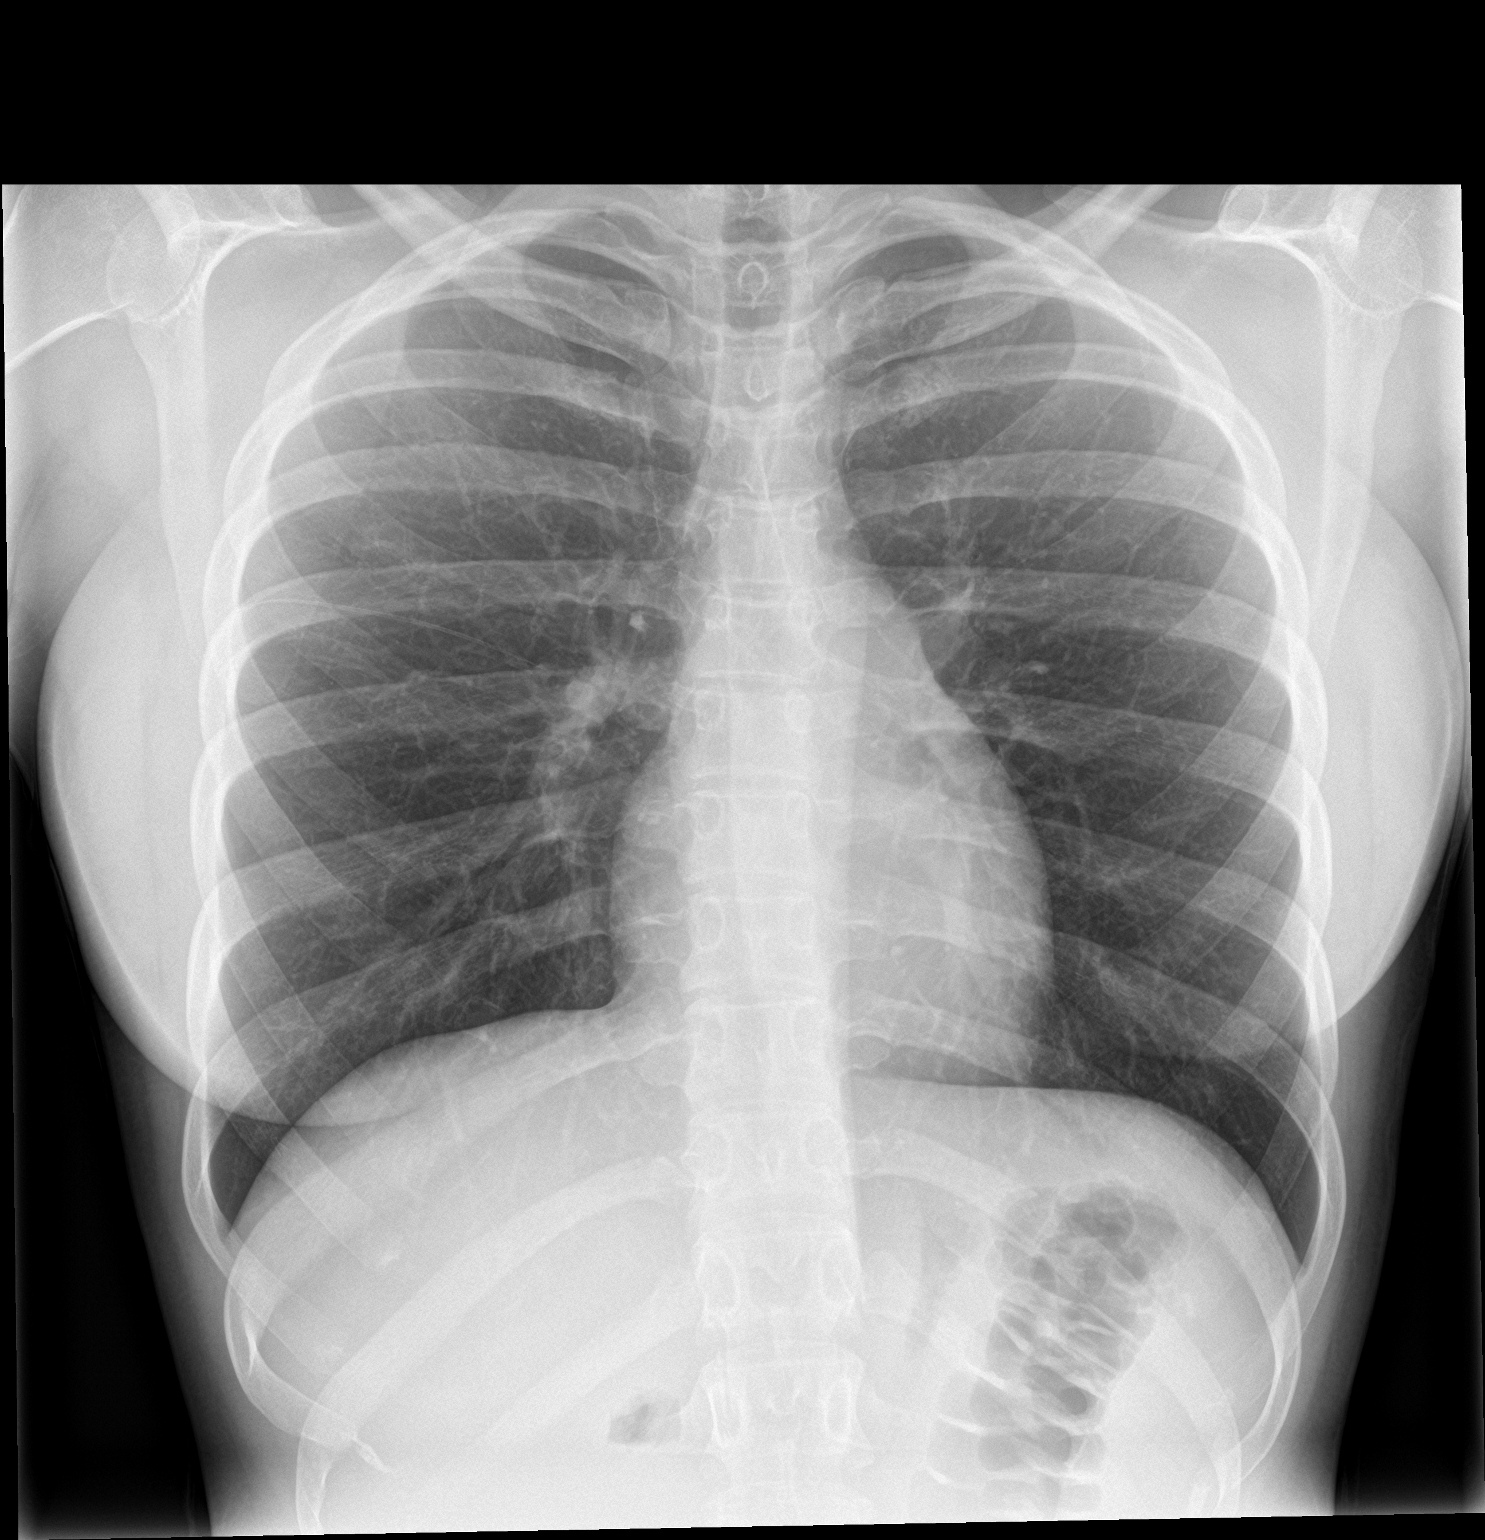

[chest lat]
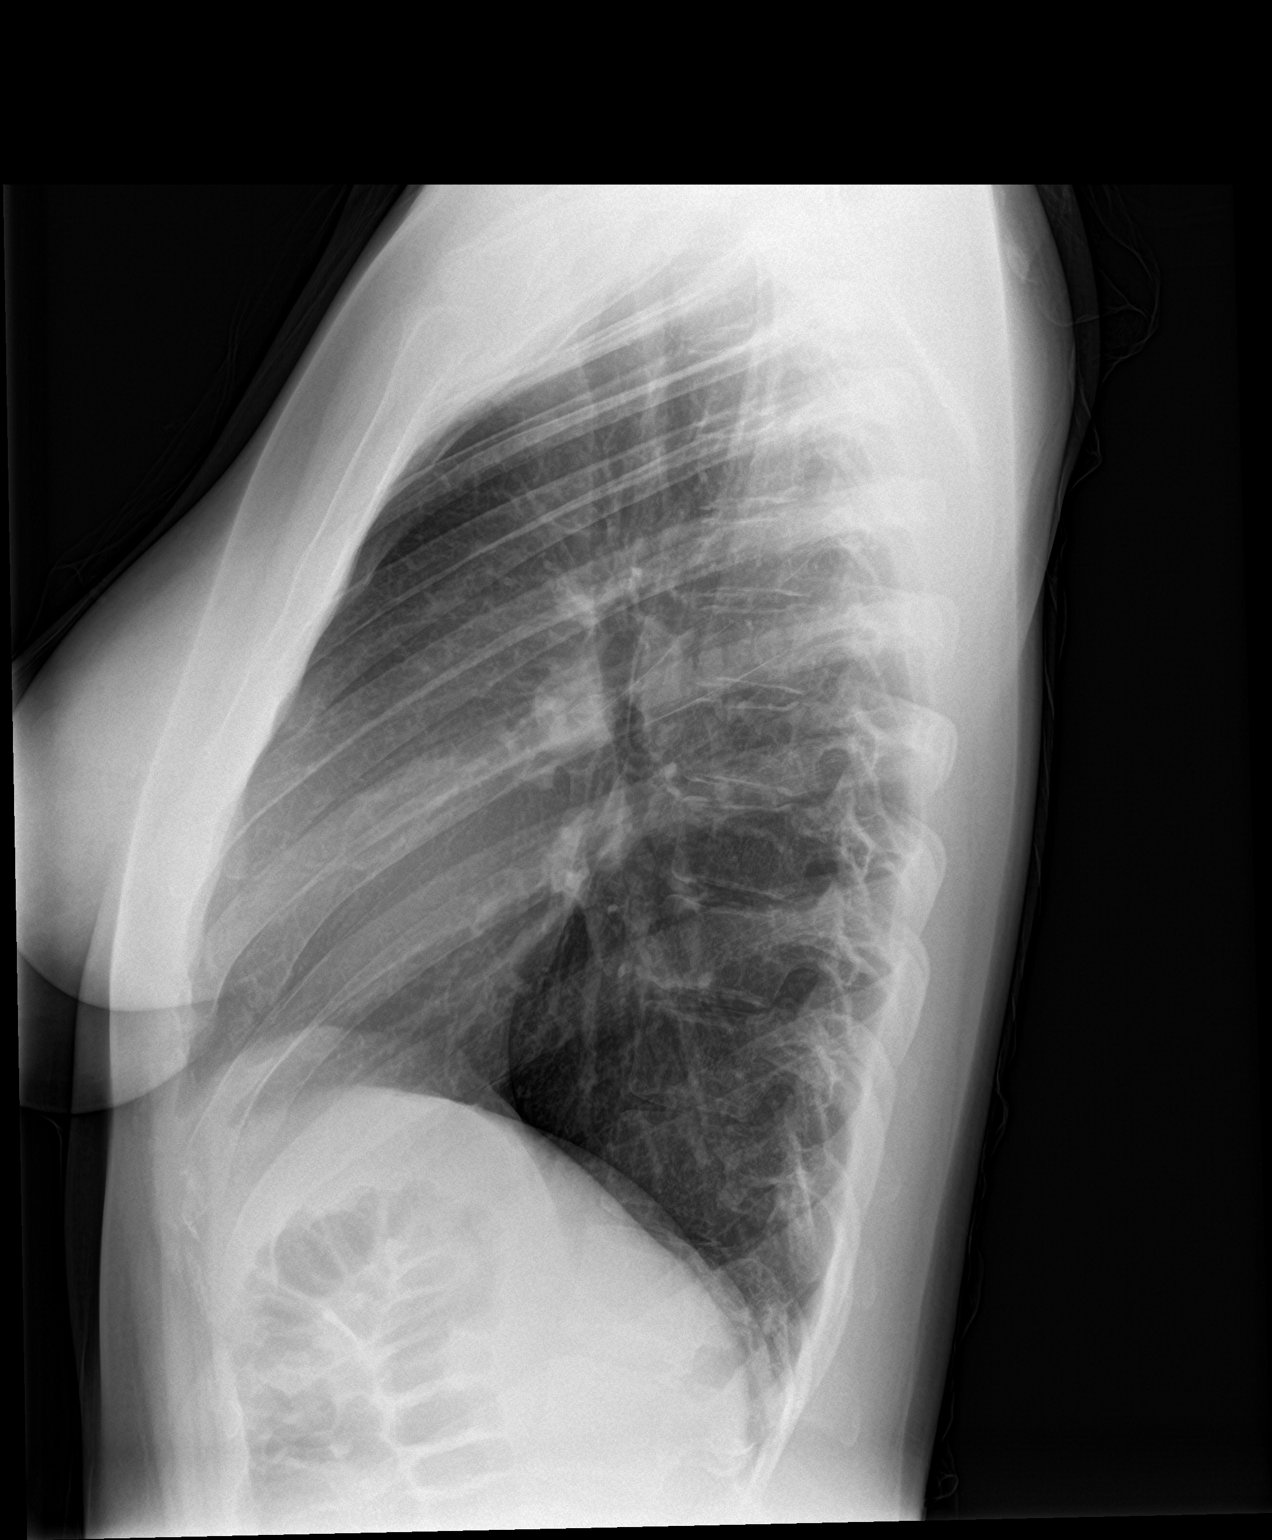

[2 of 2 positions shown; findings below may reference images not displayed]

FINDINGS: The heart size and mediastinal contours are within normal limits.
Both lungs are clear. The visualized skeletal structures are
unremarkable.
IMPRESSION: No active cardiopulmonary disease.

## 2018-06-10 IMAGING — CR DG CHEST 2V
2 series · 2 of 2 positions shown · non-contrast
Comparison: Chest radiograph 06/10/2017

CLINICAL DATA: Generalized body aches and cough.

EXAM:
CHEST  2 VIEW

[w chest pa]
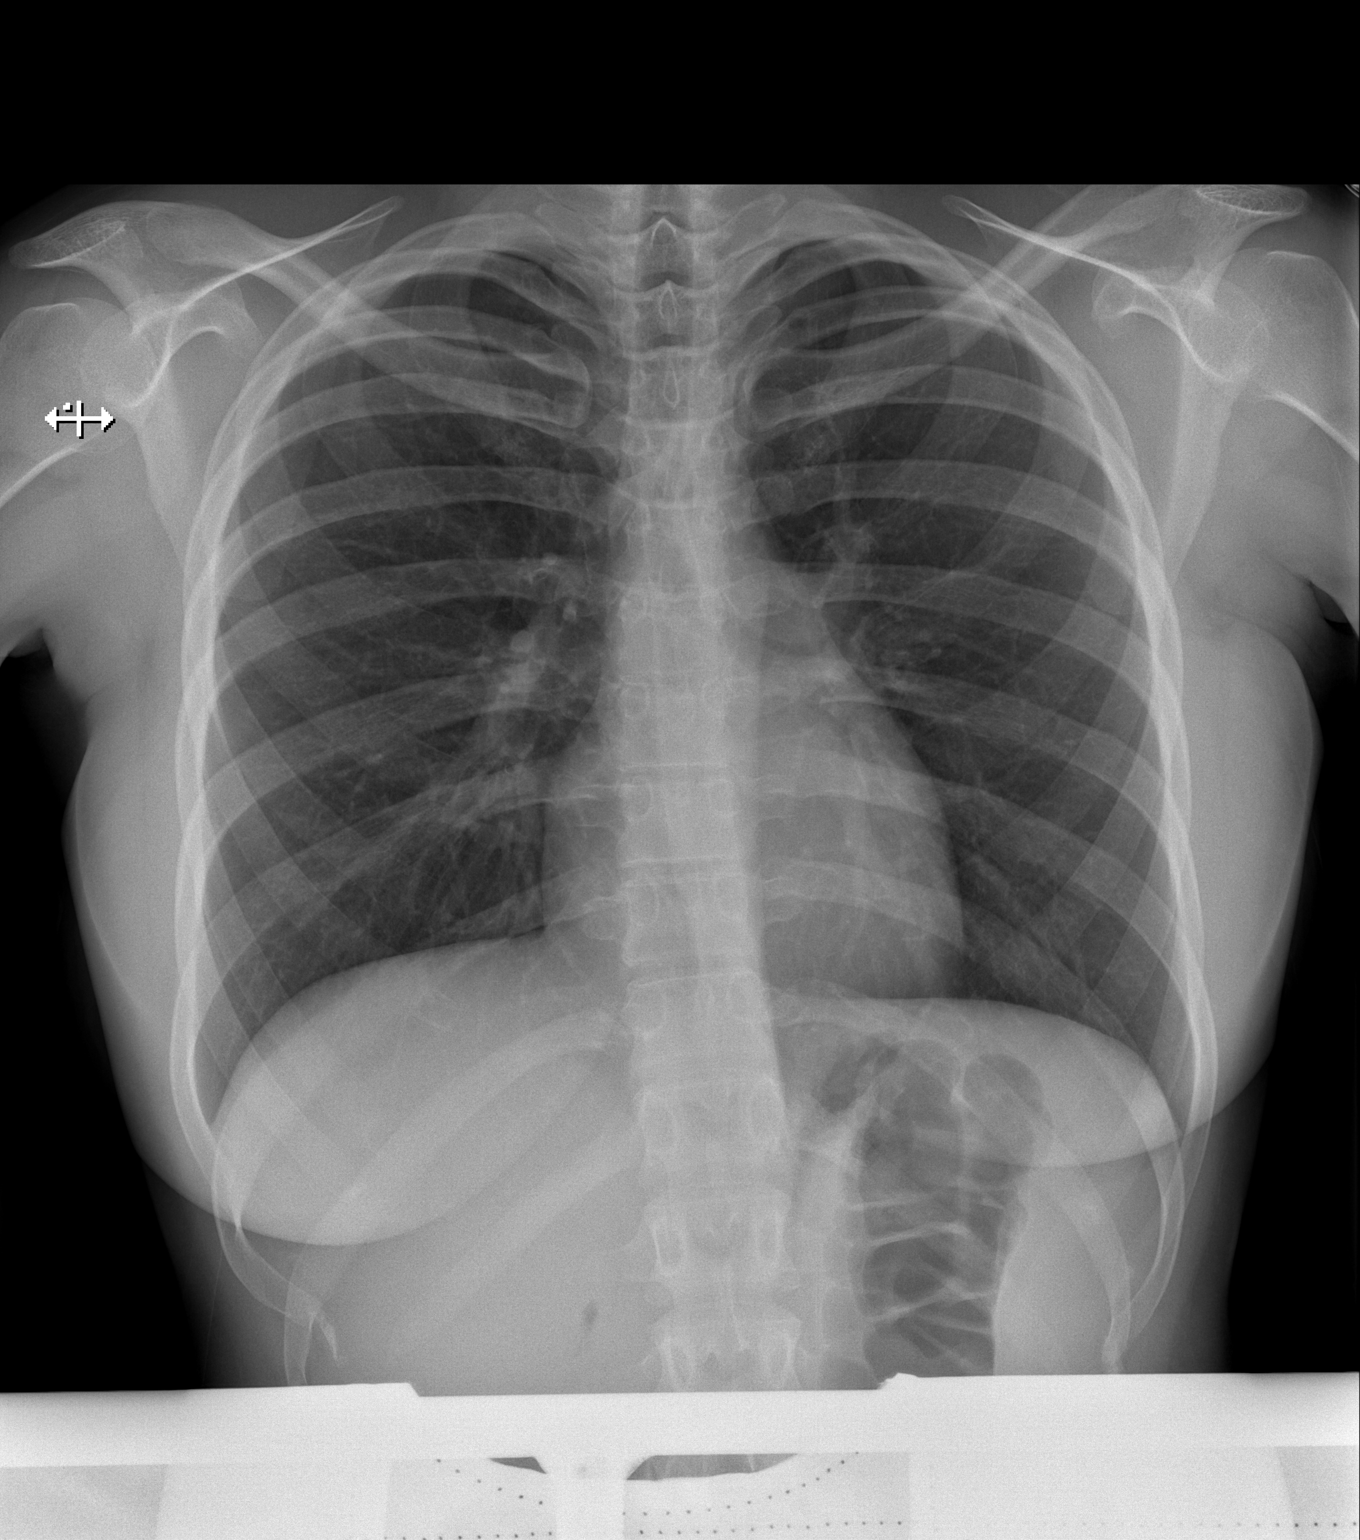

[w chest lat]
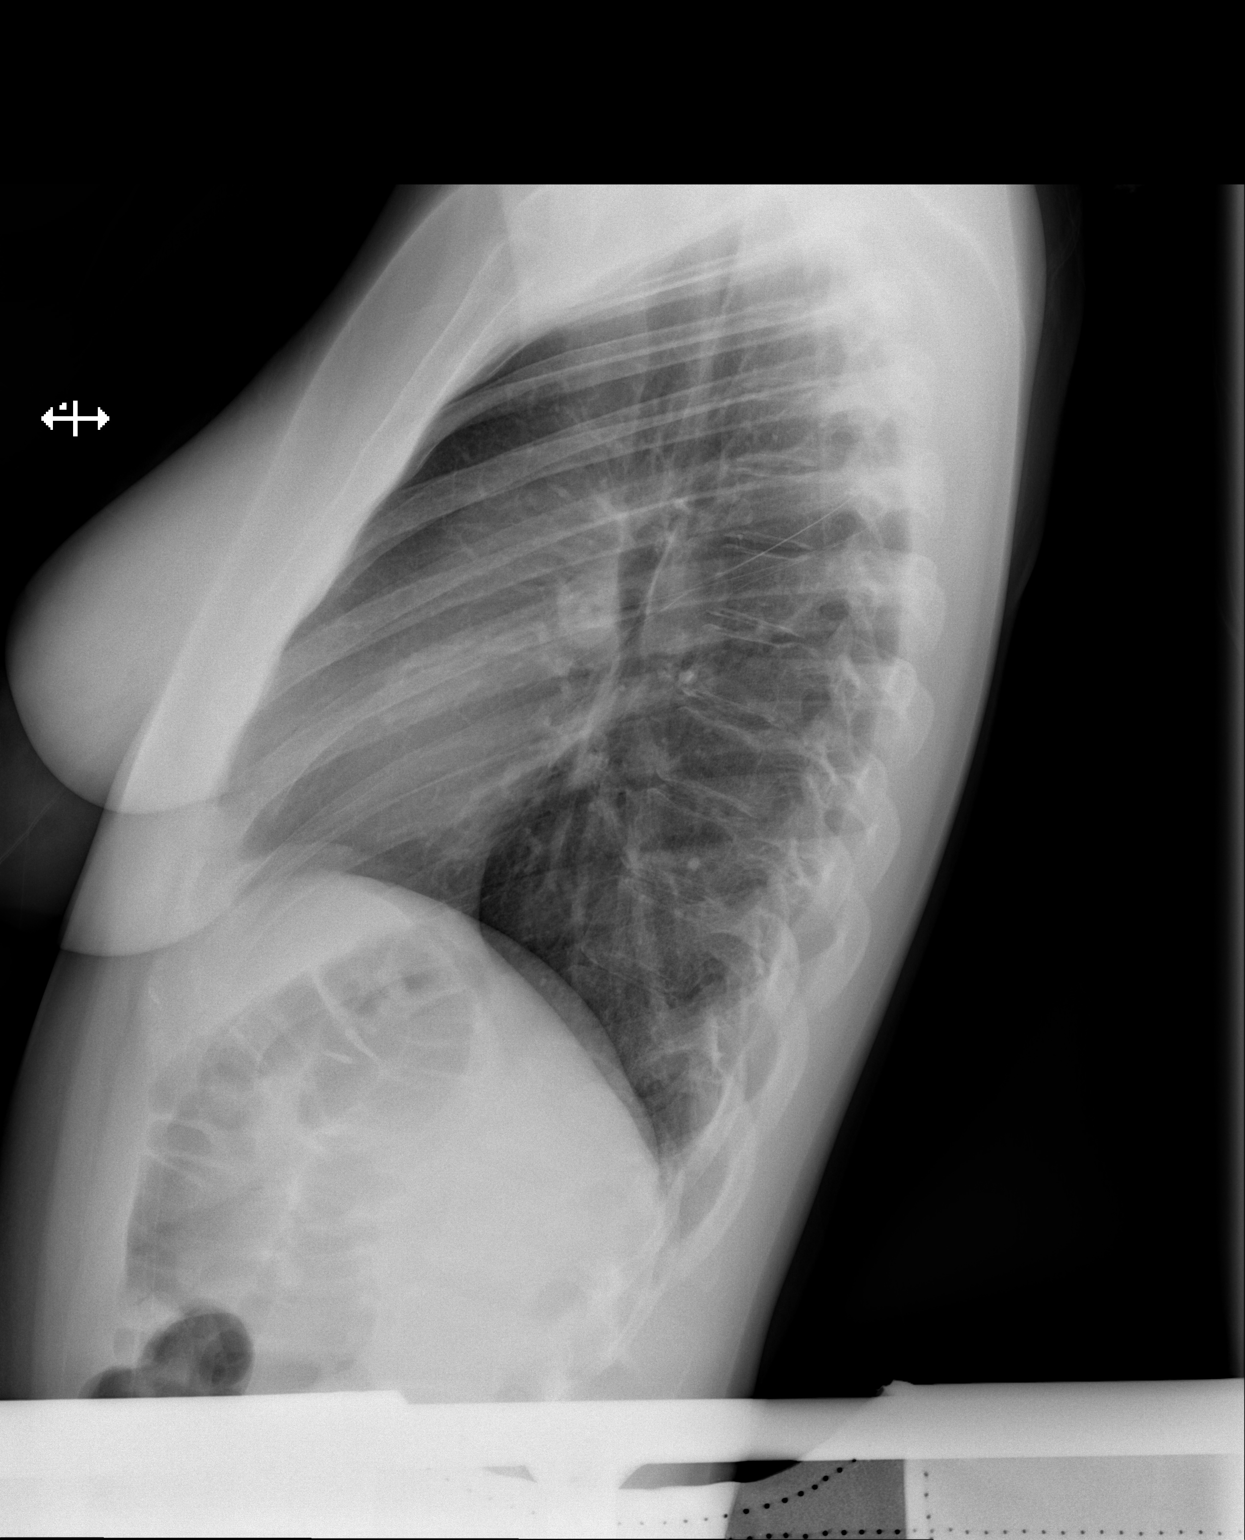

[2 of 2 positions shown; findings below may reference images not displayed]

FINDINGS: The heart size and mediastinal contours are within normal limits.
Both lungs are clear. The visualized skeletal structures are
unremarkable.
IMPRESSION: No active cardiopulmonary disease.

## 2018-07-02 ENCOUNTER — Telehealth: Payer: Self-pay

## 2018-07-02 NOTE — Telephone Encounter (Signed)
Pts mother called nurse line requesting an apt. Pts mother stated she has a fever and hasn't been eating. I informed mother as of now we have no openings.. I gave mother some recommendations on keeping her fever down and to go to urgent care if sxs do not improve or get worse. Mother said she will monitor and call us back.

## 2018-08-01 ENCOUNTER — Ambulatory Visit (INDEPENDENT_AMBULATORY_CARE_PROVIDER_SITE_OTHER): Payer: BLUE CROSS/BLUE SHIELD | Admitting: Family Medicine

## 2018-08-01 ENCOUNTER — Other Ambulatory Visit: Payer: Self-pay

## 2018-08-01 VITALS — BP 98/62 | HR 76 | Temp 98.6°F | Wt 147.2 lb

## 2018-08-01 DIAGNOSIS — B353 Tinea pedis: Secondary | ICD-10-CM

## 2018-08-01 MED ORDER — CLOTRIMAZOLE 1 % EX CREA: 1.0000 "application " | TOPICAL_CREAM | Freq: Two times a day (BID) | CUTANEOUS | 1 refills | Status: DC

## 2018-08-01 NOTE — Patient Instructions (Signed)
It was a pleasure to see you today! Thank you for choosing [facility] for your primary care. Kathleen Alexander was seen for athletes foot. Come back to the clinic if you have any routine concerns, and go to the emergency room if you have any life threatening symptoms.  Today we prescribed some antifungal cream for your foot.  Another important part of the treatment is fully drying your feet after showering and trying to keep them exposed to air as much as possible.  Please remember that some antifungals and steroids that you may buy can have a bleachinge ffect after a long period of time and you might want to apply with gloves if you use those.  If we did any lab work today that did not result today, one of two things will happen.  1. If everything is normal, you will get a letter in mail sent to the address in your chart with the results for your records.  It is important to keep your address up to date as that is where we will send results.  2. If the results require some sort of discussion, my nurses or myself will call you on the phone number listed in your records.  It is important to keep your phone number up to date in our system as this is how we will try to reach you.  If we cannot reach you on the phone, we will try to send you a letter in the mail so please enable to voicemail function of your phone.  If you don't hear from [place] in two weeks, please give [place] a call to verify your results. Otherwise, we look forward to seeing you again at your next visit. If you have any questions or concerns before then, please call the clinic at [phone].   Please bring all your medications to every doctors visit   Sign up for My Chart to have easy access to your labs results, and communication with your Primary care physician.     Please check-out at the front desk before leaving the clinic.     Best,  Dr. Sherene Sires FAMILY MEDICINE RESIDENT - PGY2 08/01/2018 3:47 PM

## 2018-08-02 DIAGNOSIS — B353 Tinea pedis: Secondary | ICD-10-CM | POA: Insufficient documentation

## 2018-08-02 HISTORY — DX: Tinea pedis: B35.3

## 2018-08-02 NOTE — Progress Notes (Signed)
    Subjective:  Kathleen Alexander is a 22 y.o. female who presents to the [facility] today with a chief complaint of itchy foot.   HPI: About 2 wks ago she went to the nail salon and since then she has had an increasing skin peeling and itchy feeling between her toes on the right foot.  No bleeding or pain, no purulence.  Itching is worse when she gets her feet wet or wears closed shoes for a long time.  This has not happened before and there was nothing different about this trip to the salon that she can remember from prior.   Objective:  Physical Exam: BP 98/62   Pulse 76   Temp 98.6 F (37 C) (Oral)   Wt 147 lb 3.2 oz (66.8 kg)   LMP 07/30/2018 (Exact Date)   SpO2 99%   BMI 24.50 kg/m   Gen: NAD, resting comfortably CV: RRR with no murmurs appreciated Pulm: NWOB, CTAB with no crackles, wheezes, or rhonchi GI: Normal bowel sounds present. Soft, Nontender, Nondistended. MSK: no edema, cyanosis, or clubbing noted Skin: right foot with peeling and mild redness between all toes of right foot (consistent with tinea pedis) Neuro: grossly normal, moves all extremities Psych: Normal affect and thought content  No results found for this or any previous visit (from the past 72 hour(s)).   Assessment/Plan:  Tinea pedis of right foot Potentially from nail salon ~2wks ago, Clotrimazole cream   Sherene Sires, DO FAMILY MEDICINE RESIDENT - PGY2 08/02/2018 7:46 AM

## 2018-08-02 NOTE — Assessment & Plan Note (Signed)
Potentially from nail salon ~2wks ago, Clotrimazole cream

## 2018-08-22 NOTE — Progress Notes (Signed)
Tried calling patient but number couldn't be completed at this time.  Will try again later today.  Jazmin Hartsell,CMA

## 2018-08-23 NOTE — Progress Notes (Signed)
Patient has an upcoming appt with PCP.  Rishit Burkhalter,CMA

## 2018-09-20 ENCOUNTER — Encounter: Payer: Self-pay | Admitting: Family Medicine

## 2018-10-11 ENCOUNTER — Encounter: Payer: Self-pay | Admitting: Family Medicine

## 2018-10-11 NOTE — Progress Notes (Deleted)
                            Review of Systems     Objective:   Physical Exam        Assessment & Plan:

## 2018-10-12 ENCOUNTER — Other Ambulatory Visit (HOSPITAL_COMMUNITY)
Admission: RE | Admit: 2018-10-12 | Discharge: 2018-10-12 | Disposition: A | Discharge status: Home / Self Care | Payer: BLUE CROSS/BLUE SHIELD | Source: Ambulatory Visit | Attending: Family Medicine | Admitting: Family Medicine

## 2018-10-12 ENCOUNTER — Ambulatory Visit (INDEPENDENT_AMBULATORY_CARE_PROVIDER_SITE_OTHER): Payer: Self-pay | Admitting: Family Medicine

## 2018-10-12 ENCOUNTER — Other Ambulatory Visit: Payer: Self-pay

## 2018-10-12 VITALS — BP 98/72 | HR 64 | Temp 98.8°F | Ht 65.0 in | Wt 143.6 lb

## 2018-10-12 DIAGNOSIS — N898 Other specified noninflammatory disorders of vagina: Secondary | ICD-10-CM

## 2018-10-12 LAB — POCT WET PREP (WET MOUNT): Clue Cells Wet Prep Whiff POC: POSITIVE

## 2018-10-12 NOTE — Progress Notes (Signed)
   Subjective:   Patient ID: Kathleen Alexander    DOB: Jun 01, 1996, 22 y.o. female   MRN: [id number]  CC: vaginal discharge  HPI: Kathleen Alexander is a 22 y.o. female who presents to clinic today for the following issue.  Vaginal discharge  Symptoms began 3d ago.  Discharge is white-yellow some some odor.  No burning with urination, frequency or urgency, no vaginal irritation or itching.  She is not married, sexually active with males only.  1 current partner, he does not have any symptoms.  No known exposure to STDs.  No history of sexually transmitted diseases in the past.   ROS: No fever, chills, nausea, vomiting.  Social: Is a never smoker. Medications reviewed. Objective:   BP 98/72   Pulse 64   Temp 98.8 F (37.1 C) (Oral)   Ht 5\' 5"  (1.651 m)   Wt 143 lb 9.6 oz (65.1 kg)   LMP 10/05/2018 (Approximate)   SpO2 99%   BMI 23.90 kg/m  Vitals and nursing note reviewed.  General: 22 year old female, NAD Neck: supple CV: RRR no MRG  Lungs: CTAB, normal effort  Abdomen: soft, NTND, +bs  Pelvic exam: VULVA: normal appearing vulva with no masses, tenderness or lesions, VAGINA: normal appearing vagina with normal color and discharge, no lesions, CERVIX: normal appearing cervix without discharge or lesions.  Skin: warm, dry, no rash  Extremities: warm and well perfused Neuro: alert, oriented x3, no focal deficits   Assessment & Plan:   Vaginal discharge No known exposure to STD.  Wet prep and GC chlamydia today. Declines RPR and HIV.  Wet prep positive for trichomonas and BV, have sent in Flagyl to pharmacy and informed patient. Counseled on safe sex and partner testing. Return precautions discussed.   Orders Placed This Encounter  Procedures  . POCT Wet Prep [facility])   Follow-up: As needed  Lovenia Kim, MD [place] PGY-3

## 2018-10-12 NOTE — Patient Instructions (Addendum)
It was nice meeting you today.  You were seen in clinic for vaginal discharge.  We performed an STD test and I will call you once the results return.   If at that time a medication needs to be called in, I will send it into your pharmacy.   Please call clinic if you have any questions.  Lovenia Kim MD

## 2018-10-15 LAB — CERVICOVAGINAL ANCILLARY ONLY
Chlamydia: NEGATIVE
NEISSERIA GONORRHEA: NEGATIVE

## 2018-10-15 MED ORDER — METRONIDAZOLE 500 MG PO TABS: 500.0000 mg | ORAL_TABLET | Freq: Two times a day (BID) | ORAL | 0 refills | Status: AC

## 2018-10-18 ENCOUNTER — Encounter: Payer: Self-pay | Admitting: Family Medicine

## 2019-02-07 ENCOUNTER — Encounter (HOSPITAL_COMMUNITY): Payer: Self-pay | Admitting: *Deleted

## 2019-02-07 ENCOUNTER — Emergency Department (HOSPITAL_COMMUNITY)
Admission: EM | Admit: 2019-02-07 | Discharge: 2019-02-07 | Disposition: A | Discharge status: Home / Self Care | Payer: BLUE CROSS/BLUE SHIELD | Attending: Emergency Medicine | Admitting: Emergency Medicine

## 2019-02-07 DIAGNOSIS — R112 Nausea with vomiting, unspecified: Secondary | ICD-10-CM | POA: Insufficient documentation

## 2019-02-07 DIAGNOSIS — R197 Diarrhea, unspecified: Secondary | ICD-10-CM | POA: Insufficient documentation

## 2019-02-07 LAB — CBC WITH DIFFERENTIAL/PLATELET
Abs Immature Granulocytes: 0.01 10*3/uL (ref 0.00–0.07)
BASOS PCT: 0 %
Basophils Absolute: 0 10*3/uL (ref 0.0–0.1)
EOS PCT: 0 %
Eosinophils Absolute: 0 10*3/uL (ref 0.0–0.5)
HCT: 39.1 % (ref 36.0–46.0)
HEMOGLOBIN: 12.4 g/dL (ref 12.0–15.0)
Immature Granulocytes: 0 %
LYMPHS PCT: 22 %
Lymphs Abs: 1 10*3/uL (ref 0.7–4.0)
MCH: 31.5 pg (ref 26.0–34.0)
MCHC: 31.7 g/dL (ref 30.0–36.0)
MCV: 99.2 fL (ref 80.0–100.0)
MONO ABS: 0.5 10*3/uL (ref 0.1–1.0)
MONOS PCT: 11 %
NEUTROS ABS: 3 10*3/uL (ref 1.7–7.7)
Neutrophils Relative %: 67 %
Platelets: 186 10*3/uL (ref 150–400)
RBC: 3.94 MIL/uL (ref 3.87–5.11)
RDW: 12.1 % (ref 11.5–15.5)
WBC: 4.5 10*3/uL (ref 4.0–10.5)
nRBC: 0 % (ref 0.0–0.2)

## 2019-02-07 LAB — COMPREHENSIVE METABOLIC PANEL
ALBUMIN: 3.9 g/dL (ref 3.5–5.0)
ALT: 16 U/L (ref 0–44)
AST: 23 U/L (ref 15–41)
Alkaline Phosphatase: 44 U/L (ref 38–126)
Anion gap: 10 (ref 5–15)
BILIRUBIN TOTAL: 0.7 mg/dL (ref 0.3–1.2)
BUN: 10 mg/dL (ref 6–20)
CO2: 22 mmol/L (ref 22–32)
Calcium: 8.9 mg/dL (ref 8.9–10.3)
Chloride: 103 mmol/L (ref 98–111)
Creatinine, Ser: 0.96 mg/dL (ref 0.44–1.00)
GFR calc Af Amer: >60 mL/min (ref >60)
GLUCOSE: 88 mg/dL (ref 70–99)
POTASSIUM: 3.4 mmol/L — AB (ref 3.5–5.1)
Sodium: 135 mmol/L (ref 135–145)
TOTAL PROTEIN: 7.2 g/dL (ref 6.5–8.1)

## 2019-02-07 LAB — URINALYSIS, ROUTINE W REFLEX MICROSCOPIC
Bilirubin Urine: NEGATIVE
Glucose, UA: NEGATIVE mg/dL
Hgb urine dipstick: NEGATIVE
Ketones, ur: 20 mg/dL — AB
Nitrite: NEGATIVE
Protein, ur: 30 mg/dL — AB
Specific Gravity, Urine: 1.025 (ref 1.005–1.030)
pH: 5 (ref 5.0–8.0)

## 2019-02-07 LAB — PREGNANCY, URINE: Preg Test, Ur: NEGATIVE

## 2019-02-07 MED ORDER — ONDANSETRON HCL 4 MG/2ML IJ SOLN
4.0000 mg | Freq: Once | INTRAMUSCULAR | Status: AC
  Administered 2019-02-07: 4 mg via INTRAVENOUS
  Filled 2019-02-07: qty 2

## 2019-02-07 MED ORDER — KETOROLAC TROMETHAMINE 30 MG/ML IJ SOLN
15.0000 mg | Freq: Once | INTRAMUSCULAR | Status: AC
  Administered 2019-02-07: 15 mg via INTRAVENOUS
  Filled 2019-02-07: qty 1

## 2019-02-07 MED ORDER — SODIUM CHLORIDE 0.9 % IV BOLUS
1000.0000 mL | Freq: Once | INTRAVENOUS | Status: AC
  Administered 2019-02-07: 1000 mL via INTRAVENOUS

## 2019-02-07 MED ORDER — ONDANSETRON 4 MG PO TBDP: 4.0000 mg | ORAL_TABLET | Freq: Three times a day (TID) | ORAL | 0 refills | Status: DC | PRN

## 2019-02-07 NOTE — Discharge Instructions (Addendum)
Try to keep yourself hydrated.  Watch for worsening pain.

## 2019-02-07 NOTE — ED Provider Notes (Signed)
[facility] EMERGENCY DEPARTMENT Provider Note   CSN: [id number] Arrival date & time: 02/07/19  1422     History   Chief Complaint Chief Complaint  Patient presents with  . Emesis    HPI Kathleen Alexander is a 23 y.o. female.  HPI Presents with nausea vomiting and diarrhea.  Began yesterday.  Thinks it could have been something that she ate although novomiting.  Had some intermittent right lower quadrant pain that time but severe.  Denies pregnancy.  States she is had chills and will feel hot and cold at times.  No vaginal bleeding or discharge. Past Medical History:  Diagnosis Date  . History of chlamydia 03/22/2017  . History of gonorrhea 03/22/2017  . Low grade squamous intraepithelial lesion (LGSIL) on cervical Pap smear 03/15/2017    Patient Active Problem List   Diagnosis Date Noted  . Tinea pedis of right foot 08/02/2018  . Breast pain 06/12/2017  . History of gonorrhea 03/22/2017  . History of chlamydia 03/22/2017  . Contraceptive management 03/22/2017  . Healthcare maintenance 03/15/2017  . Low grade squamous intraepithelial lesion (LGSIL) on cervical Pap smear 03/15/2017  . VISUAL ACUITY, DECREASED 10/06/2008  . SCAR/FIBROSIS, SKIN 10/01/2007  . ACNE 02/22/2007    History reviewed. No pertinent surgical history.   OB History   No obstetric history on file.      Home Medications    Prior to Admission medications   Medication Sig Start Date End Date Taking? Authorizing Provider  clotrimazole (LOTRIMIN) 1 % cream Apply 1 application topically 2 (two) times daily. 08/01/18   Sherene Sires, DO  ibuprofen (ADVIL,MOTRIN) 600 MG tablet Take 1 tablet (600 mg total) by mouth every 6 (six) hours as needed. 06/12/17   Varney Biles, MD  magic mouthwash w/lidocaine SOLN Take 5 mLs by mouth 3 (three) times daily as needed for mouth pain. 06/12/17   Varney Biles, MD  ondansetron (ZOFRAN-ODT) 4 MG disintegrating tablet Take 1 tablet (4 mg total) by  mouth every 8 (eight) hours as needed for nausea or vomiting. 02/07/19   Davonna Belling, MD    Family History Family History  Problem Relation Age of Onset  . Hypertension Other   . CAD Other     Social History Social History   Tobacco Use  . Smoking status: Never Smoker  . Smokeless tobacco: Never Used  Substance Use Topics  . Alcohol use: No  . Drug use: No     Allergies   Patient has no known allergies.   Review of Systems Review of Systems  Constitutional: Positive for appetite change and chills.  HENT: Negative for congestion.   Respiratory: Negative for shortness of breath.   Gastrointestinal: Positive for abdominal pain, diarrhea, nausea and vomiting.  Genitourinary: Negative for dysuria, flank pain, vaginal bleeding, vaginal discharge and vaginal pain.  Musculoskeletal: Negative for back pain.  Skin: Negative for rash.  Neurological: Negative for weakness.  Psychiatric/Behavioral: Negative for confusion.     Physical Exam Updated Vital Signs BP 123/86   Pulse 72   Temp 98 F (36.7 C) (Oral)   Resp 18   SpO2 99%   Physical Exam HENT:     Head: Atraumatic.     Mouth/Throat:     Mouth: Mucous membranes are moist.  Neck:     Musculoskeletal: Neck supple.  Cardiovascular:     Rate and Rhythm: Regular rhythm.     Heart sounds: Normal heart sounds.  Pulmonary:     Effort:  Pulmonary effort is normal.  Abdominal:     General: Abdomen is flat.     Tenderness: There is no abdominal tenderness.  Musculoskeletal:     Right lower leg: No edema.     Left lower leg: No edema.  Skin:    General: Skin is warm.     Capillary Refill: Capillary refill takes less than 2 seconds.     Coloration: Skin is not jaundiced.  Neurological:     General: No focal deficit present.     Mental Status: She is alert.      ED Treatments / Results  Labs (all labs ordered are listed, but only abnormal results are displayed) Labs Reviewed  COMPREHENSIVE METABOLIC  PANEL - Abnormal; Notable for the following components:      Result Value   Potassium 3.4 (*)    All other components within normal limits  URINALYSIS, ROUTINE W REFLEX MICROSCOPIC - Abnormal; Notable for the following components:   APPearance HAZY (*)    Ketones, ur 20 (*)    Protein, ur 30 (*)    Leukocytes,Ua MODERATE (*)    Bacteria, UA FEW (*)    All other components within normal limits  CBC WITH DIFFERENTIAL/PLATELET  PREGNANCY, URINE    EKG None  Radiology No results found.  Procedures Procedures (including critical care time)  Medications Ordered in ED Medications  sodium chloride 0.9 % bolus 1,000 mL (0 mLs Intravenous Stopped 02/07/19 1637)  ondansetron (ZOFRAN) injection 4 mg (4 mg Intravenous Given 02/07/19 1532)  ketorolac (TORADOL) 30 MG/ML injection 15 mg (15 mg Intravenous Given 02/07/19 1539)     Initial Impression / Assessment and Plan / ED Course  I have reviewed the triage vital signs and the nursing notes.  Pertinent labs & imaging results that were available during my care of the patient were reviewed by me and considered in my medical decision making (see chart for details).     Patient with nausea vomiting diarrhea.  Lab work reassuring.  Feels better after IV fluids and treatment.  Discharge home. Final Clinical Impressions(s) / ED Diagnoses   Final diagnoses:  Nausea vomiting and diarrhea    ED Discharge Orders         Ordered    ondansetron (ZOFRAN-ODT) 4 MG disintegrating tablet  Every 8 hours PRN     02/07/19 1749           Davonna Belling, MD 02/07/19 1829

## 2019-02-07 NOTE — ED Notes (Signed)
Discharge instructions and prescription discussed with Pt. Pt verbalized understanding. Pt stable and ambulatory.    

## 2019-02-07 NOTE — ED Notes (Signed)
Pt aware that a urine sample is needed and will provide one when able. 

## 2019-02-07 NOTE — ED Triage Notes (Addendum)
Pt in c/o n/v/d since yesterday, last vomiting episode this am and last diarrhea episode this am, one episode in the 24 hours, states she thinks she has food poisoning after eating [employer] pizza yesterday- reports abdominal cramping to her RLQ that is intermittent

## 2019-04-09 ENCOUNTER — Emergency Department (HOSPITAL_COMMUNITY)
Admission: EM | Admit: 2019-04-09 | Discharge: 2019-04-09 | Disposition: A | Discharge status: Home / Self Care | Payer: Self-pay | Attending: Emergency Medicine | Admitting: Emergency Medicine

## 2019-04-09 ENCOUNTER — Encounter (HOSPITAL_COMMUNITY): Payer: Self-pay | Admitting: Emergency Medicine

## 2019-04-09 ENCOUNTER — Other Ambulatory Visit: Payer: Self-pay

## 2019-04-09 DIAGNOSIS — Z79899 Other long term (current) drug therapy: Secondary | ICD-10-CM | POA: Insufficient documentation

## 2019-04-09 DIAGNOSIS — K0889 Other specified disorders of teeth and supporting structures: Secondary | ICD-10-CM | POA: Insufficient documentation

## 2019-04-09 MED ORDER — PENICILLIN V POTASSIUM 500 MG PO TABS: 500.0000 mg | ORAL_TABLET | Freq: Four times a day (QID) | ORAL | 0 refills | Status: AC

## 2019-04-09 NOTE — Discharge Instructions (Addendum)
Please read attached information. If you experience any new or worsening signs or symptoms please return to the emergency room for evaluation. Please follow-up with your primary care provider or specialist as discussed. Please use medication prescribed only as directed and discontinue taking if you have any concerning signs or symptoms.   °

## 2019-04-09 NOTE — ED Provider Notes (Signed)
[facility] EMERGENC[facility] DEPARTMENT Provider Note   CSN: [id number] Arrival date & time: 04/09/19  1055    History   Chief Complaint Chief Complaint  Patient presents with  . Dental Pain    HPI Kathleen Alexander is a 23 y.o. female.     HPI   22 year old female presents today with complaints of dental pain.  Patient notes history of chronic right upper dental pain.  She notes dental decay at the right second upper molar that broke yesterday.  She notes increasing pain and swelling around the gums, some swel.  She notes she has been seen previously and told that she needed to see an oral surgeon as the dentist could not remove the tooth.  She denies any fever, notes taking ibuprofen at home.  She is not pregnant or breast-feeding.  She has no allergies.  Past Medical History:  Diagnosis Date  . History of chlamydia 03/22/2017  . History of gonorrhea 03/22/2017  . Low grade squamous intraepithelial lesion (LGSIL) on cervical Pap smear 03/15/2017    Patient Active Problem List   Diagnosis Date Noted  . Tinea pedis of right foot 08/02/2018  . Breast pain 06/12/2017  . History of gonorrhea 03/22/2017  . History of chlamydia 03/22/2017  . Contraceptive management 03/22/2017  . Healthcare maintenance 03/15/2017  . Low grade squamous intraepithelial lesion (LGSIL) on cervical Pap smear 03/15/2017  . VISUAL ACUITY, DECREASED 10/06/2008  . SCAR/FIBROSIS, SKIN 10/01/2007  . ACNE 02/22/2007    History reviewed. No pertinent surgical history.   OB History   No obstetric history on file.      Home Medications    Prior to Admission medications   Medication Sig Start Date End Date Taking? Authorizing Provider  clotrimazole (LOTRIMIN) 1 % cream Apply 1 application topically 2 (two) times daily. 08/01/18   Sherene Sires, DO  ibuprofen (ADVIL,MOTRIN) 600 MG tablet Take 1 tablet (600 mg total) by mouth every 6 (six) hours as needed. 06/12/17   Varney Biles, MD  magic  mouthwash w/lidocaine SOLN Take 5 mLs by mouth 3 (three) times daily as needed for mouth pain. 06/12/17   Varney Biles, MD  ondansetron (ZOFRAN-ODT) 4 MG disintegrating tablet Take 1 tablet (4 mg total) by mouth every 8 (eight) hours as needed for nausea or vomiting. 02/07/19   Davonna Belling, MD  penicillin v potassium (VEETID) 500 MG tablet Take 1 tablet (500 mg total) by mouth 4 (four) times daily for 7 days. 04/09/19 04/16/19  Okey Regal, PA-C    Family History Family History  Problem Relation Age of Onset  . Hypertension Other   . CAD Other     Social History Social History   Tobacco Use  . Smoking status: Never Smoker  . Smokeless tobacco: Never Used  Substance Use Topics  . Alcohol use: No  . Drug use: No     Allergies   Patient has no known allergies.   Review of Systems Review of Systems  All other systems reviewed and are negative.    Physical Exam Updated Vital Signs BP 122/86 (BP Location: Right Arm)   Pulse 82   Temp 98.6 F (37 C) (Oral)   Resp 16   Wt 65.1 kg   SpO2 100%   BMI 23.88 kg/m   Physical Exam Vitals signs and nursing note reviewed.  Constitutional:      Appearance: She is well-developed.  HENT:     Head: Normocephalic and atraumatic.  Comments: Dental decay with fractured tooth 2nd right upper molar - gumline palpated without swelling, no fluctuance - jaw full active ROM  Eyes:     General: No scleral icterus.       Right eye: No discharge.        Left eye: No discharge.     Conjunctiva/sclera: Conjunctivae normal.     Pupils: Pupils are equal, round, and reactive to light.  Neck:     Musculoskeletal: Normal range of motion.     Vascular: No JVD.     Trachea: No tracheal deviation.  Pulmonary:     Effort: Pulmonary effort is normal.     Breath sounds: No stridor.  Neurological:     Mental Status: She is alert and oriented to person, place, and time.     Coordination: Coordination normal.  Psychiatric:         Behavior: Behavior normal.        Thought Content: Thought content normal.        Judgment: Judgment normal.      ED Treatments / Results  Labs (all labs ordered are listed, but only abnormal results are displayed) Labs Reviewed - No data to display  EKG None  Radiology No results found.  Procedures Procedures (including critical care time)  Medications Ordered in ED Medications - No data to display   Initial Impression / Assessment and Plan / ED Course  I have reviewed the triage vital signs and the nursing notes.  Pertinent labs & imaging results that were available during my care of the patient were reviewed by me and considered in my medical decision making (see chart for details).       Labs:   Imaging:  Consults:  Therapeutics:  Discharge Meds:   Assessment/Plan: 22 YOF with dental decay and disintegrating tooth.  Patient reports swelling, no objective findings on my exam.  She was treated with antibiotics encouraged follow-up with oral surgery return precautions given.  She verbalized understanding and agreement to today's plan.  Patient knows she is not pregnant or breast-feeding.   Final Clinical Impressions(s) / ED Diagnoses   Final diagnoses:  Pain, dental    ED Discharge Orders         Ordered    penicillin v potassium (VEETID) 500 MG tablet  4 times daily     04/09/19 1111           Okey Regal, PA-C 04/09/19 1520    Maudie Flakes, MD 04/10/19 [phone]

## 2019-04-09 NOTE — ED Notes (Signed)
Patient verbalizes understanding of discharge instructions. Opportunity for questioning and answers were provided. Armband removed by staff, pt discharged from ED.  

## 2019-04-09 NOTE — ED Triage Notes (Signed)
Pt in with c/o R upper molar dental pain x 1 yr. States a part broke off yesterday, noticing some swelling around the gums. Denies any fevers/drainage

## 2019-04-19 ENCOUNTER — Emergency Department (HOSPITAL_COMMUNITY)
Admission: EM | Admit: 2019-04-19 | Discharge: 2019-04-19 | Disposition: A | Discharge status: Home / Self Care | Payer: Self-pay | Attending: Emergency Medicine | Admitting: Emergency Medicine

## 2019-04-19 ENCOUNTER — Other Ambulatory Visit: Payer: Self-pay

## 2019-04-19 ENCOUNTER — Encounter (HOSPITAL_COMMUNITY): Payer: Self-pay | Admitting: *Deleted

## 2019-04-19 DIAGNOSIS — Z5321 Procedure and treatment not carried out due to patient leaving prior to being seen by health care provider: Secondary | ICD-10-CM | POA: Insufficient documentation

## 2019-04-19 DIAGNOSIS — R102 Pelvic and perineal pain: Secondary | ICD-10-CM | POA: Insufficient documentation

## 2019-04-19 NOTE — ED Triage Notes (Signed)
The pt has had vaginal pain for 2 days with burning and itching  lmp now

## 2019-04-19 NOTE — ED Notes (Signed)
Pt left. 

## 2019-04-21 ENCOUNTER — Emergency Department (HOSPITAL_COMMUNITY)
Admission: EM | Admit: 2019-04-21 | Discharge: 2019-04-21 | Disposition: A | Discharge status: Home / Self Care | Payer: Self-pay | Attending: Emergency Medicine | Admitting: Emergency Medicine

## 2019-04-21 ENCOUNTER — Other Ambulatory Visit: Payer: Self-pay

## 2019-04-21 ENCOUNTER — Encounter (HOSPITAL_COMMUNITY): Payer: Self-pay | Admitting: *Deleted

## 2019-04-21 DIAGNOSIS — B9689 Other specified bacterial agents as the cause of diseases classified elsewhere: Secondary | ICD-10-CM

## 2019-04-21 DIAGNOSIS — N3090 Cystitis, unspecified without hematuria: Secondary | ICD-10-CM | POA: Insufficient documentation

## 2019-04-21 DIAGNOSIS — Z113 Encounter for screening for infections with a predominantly sexual mode of transmission: Secondary | ICD-10-CM | POA: Insufficient documentation

## 2019-04-21 DIAGNOSIS — N760 Acute vaginitis: Secondary | ICD-10-CM | POA: Insufficient documentation

## 2019-04-21 LAB — URINALYSIS, ROUTINE W REFLEX MICROSCOPIC
Bilirubin Urine: NEGATIVE
Glucose, UA: NEGATIVE mg/dL
Ketones, ur: NEGATIVE mg/dL
Nitrite: NEGATIVE
Protein, ur: NEGATIVE mg/dL
Specific Gravity, Urine: 1.015 (ref 1.005–1.030)
pH: 6 (ref 5.0–8.0)

## 2019-04-21 LAB — WET PREP, GENITAL
Sperm: NONE SEEN
Trich, Wet Prep: NONE SEEN
Yeast Wet Prep HPF POC: NONE SEEN

## 2019-04-21 MED ORDER — CEPHALEXIN 500 MG PO CAPS: 500.0000 mg | ORAL_CAPSULE | Freq: Four times a day (QID) | ORAL | 0 refills | Status: AC

## 2019-04-21 MED ORDER — METRONIDAZOLE 500 MG PO TABS: 500.0000 mg | ORAL_TABLET | Freq: Two times a day (BID) | ORAL | 0 refills | Status: DC

## 2019-04-21 NOTE — ED Triage Notes (Signed)
Pt was here on 4-24 and left without being seen . Pt returns with reported dysuria . Pt denies vag. Discharge.

## 2019-04-21 NOTE — ED Provider Notes (Signed)
[facility] EMERGENCY DEPARTMENT Provider Note   CSN: [id number] Arrival date & time: 04/21/19  5462    History   Chief Complaint Chief Complaint  Patient presents with  . Dysuria    HPI Kathleen Alexander is a 23 y.o. female.     The history is provided by the patient and medical records. No language interpreter was used.  Dysuria  Associated symptoms: no abdominal pain, no fever, no flank pain, no nausea, no vaginal discharge and no vomiting    Kathleen Alexander is a 23 y.o. female  with a PMH of STI's who presents to the Emergency Department complaining of dysuria over the last 3 days.  Denies any alleviating or aggravating factors.  Does report some intermittent vaginal itching.  Denies any vaginal discharge.  She is sexually active.  Denies any fever, abdominal pain, nausea, vomiting, back pain, urinary urgency or frequency.  Does report history of similar in the past both with UTI and STDs.  Past Medical History:  Diagnosis Date  . History of chlamydia 03/22/2017  . History of gonorrhea 03/22/2017  . Low grade squamous intraepithelial lesion (LGSIL) on cervical Pap smear 03/15/2017    Patient Active Problem List   Diagnosis Date Noted  . Tinea pedis of right foot 08/02/2018  . Breast pain 06/12/2017  . History of gonorrhea 03/22/2017  . History of chlamydia 03/22/2017  . Contraceptive management 03/22/2017  . Healthcare maintenance 03/15/2017  . Low grade squamous intraepithelial lesion (LGSIL) on cervical Pap smear 03/15/2017  . VISUAL ACUITY, DECREASED 10/06/2008  . SCAR/FIBROSIS, SKIN 10/01/2007  . ACNE 02/22/2007    History reviewed. No pertinent surgical history.   OB History   No obstetric history on file.      Home Medications    Prior to Admission medications   Medication Sig Start Date End Date Taking? Authorizing Provider  cephALEXin (KEFLEX) 500 MG capsule Take 1 capsule (500 mg total) by mouth 4 (four) times daily for 7 days.  04/21/19 04/28/19  Ward, Ozella Almond, PA-C  clotrimazole (LOTRIMIN) 1 % cream Apply 1 application topically 2 (two) times daily. 08/01/18   Sherene Sires, DO  ibuprofen (ADVIL,MOTRIN) 600 MG tablet Take 1 tablet (600 mg total) by mouth every 6 (six) hours as needed. 06/12/17   Varney Biles, MD  magic mouthwash w/lidocaine SOLN Take 5 mLs by mouth 3 (three) times daily as needed for mouth pain. 06/12/17   Varney Biles, MD  metroNIDAZOLE (FLAGYL) 500 MG tablet Take 1 tablet (500 mg total) by mouth 2 (two) times daily. 04/21/19   Ward, Ozella Almond, PA-C  ondansetron (ZOFRAN-ODT) 4 MG disintegrating tablet Take 1 tablet (4 mg total) by mouth every 8 (eight) hours as needed for nausea or vomiting. 02/07/19   Davonna Belling, MD    Family History Family History  Problem Relation Age of Onset  . Hypertension Other   . CAD Other     Social History Social History   Tobacco Use  . Smoking status: Never Smoker  . Smokeless tobacco: Never Used  Substance Use Topics  . Alcohol use: No  . Drug use: No     Allergies   Patient has no known allergies.   Review of Systems Review of Systems  Constitutional: Negative for fever.  Gastrointestinal: Negative for abdominal pain, constipation, diarrhea, nausea and vomiting.  Genitourinary: Positive for dysuria and vaginal bleeding (At the end of her menstrual cycle). Negative for flank pain, frequency, genital sores, pelvic pain, urgency  and vaginal discharge.       + Vaginal itching  Musculoskeletal: Negative for back pain.     Physical Exam Updated Vital Signs BP (!) 106/53 (BP Location: Right Arm)   Pulse 82   Temp 98.6 F (37 C) (Oral)   Resp 16   Ht 5\' 6"  (1.676 m)   Wt 67.1 kg   LMP 04/19/2019   SpO2 100%   BMI 23.88 kg/m   Physical Exam Vitals signs and nursing note reviewed.  Constitutional:      General: She is not in acute distress.    Appearance: She is well-developed.     Comments: Well-appearing  HENT:     Head:  Normocephalic and atraumatic.  Neck:     Musculoskeletal: Neck supple.  Cardiovascular:     Rate and Rhythm: Normal rate and regular rhythm.     Heart sounds: Normal heart sounds. No murmur.  Pulmonary:     Effort: Pulmonary effort is normal. No respiratory distress.     Breath sounds: Normal breath sounds.  Abdominal:     General: There is no distension.     Palpations: Abdomen is soft.     Comments: No abdominal, flank or CVA tenderness.  Genitourinary:    Comments: Patient declined full speculum examination, but did do modified GU exam.  No genital sores or lesions appreciated.  No obvious discharge that I could see. Skin:    General: Skin is warm and dry.  Neurological:     Mental Status: She is alert and oriented to person, place, and time.      ED Treatments / Results  Labs (all labs ordered are listed, but only abnormal results are displayed) Labs Reviewed  WET PREP, GENITAL - Abnormal; Notable for the following components:      Result Value   Clue Cells Wet Prep HPF POC PRESENT (*)    WBC, Wet Prep HPF POC RARE (*)    All other components within normal limits  URINALYSIS, ROUTINE W REFLEX MICROSCOPIC - Abnormal; Notable for the following components:   APPearance CLOUDY (*)    Hgb urine dipstick LARGE (*)    Leukocytes,Ua LARGE (*)    Bacteria, UA RARE (*)    All other components within normal limits  GC/CHLAMYDIA PROBE AMP ([place]) NOT AT [facility]    EKG None  Radiology No results found.  Procedures Procedures (including critical care time)  Medications Ordered in ED Medications - No data to display   Initial Impression / Assessment and Plan / ED Course  I have reviewed the triage vital signs and the nursing notes.  Pertinent labs & imaging results that were available during my care of the patient were reviewed by me and considered in my medical decision making (see chart for details).       Kathleen Alexander is a 23 y.o. female who presents to  ED for dysuria for the last 3 days.  On exam, patient is afebrile, hemodynamically stable with no abdominal, flank or CVA tenderness.  She declined speculum examination, however given her history of STIs, I would very much like to get a wet prep and gonorrhea/chlamydia test.  After shared decision making conversation, she did agree to perform just the swab samples, without doing full speculum.  I performed swab samples and she did not seem overtly tender or in any discomfort.  I did not appreciate any large amount of vaginal discharge, but did notice some bleeding consistent with the end of  her menstrual cycle today.  Given her overall well-appearing state with no abdominal tenderness and what does not appear to be pelvic tenderness, doubt TOA or PID.  Wet prep with clue cells. UA with large leuks and 21-50 wbc's. Does have a number of squamous cells as well so could be contaminant, but given dysuria will treat. Patient is aware that she has pending gonorrhea and Chlamydia testing and that she will be notified if these results are positive. Recommend PCP, GYN or health department follow up. Discussed reasons to return to ER. All questions answered.   Final Clinical Impressions(s) / ED Diagnoses   Final diagnoses:  Cystitis  BV (bacterial vaginosis)    ED Discharge Orders         Ordered    cephALEXin (KEFLEX) 500 MG capsule  4 times daily     04/21/19 0935    metroNIDAZOLE (FLAGYL) 500 MG tablet  2 times daily     04/21/19 0935           Ward, Ozella Almond, PA-C 04/21/19 0932    Jola Schmidt, MD 04/22/19 2127

## 2019-04-21 NOTE — ED Notes (Signed)
Patient verbalizes understanding of discharge instructions . Opportunity for questions and answers were provided . Armband removed by staff ,Pt discharged from ED. W/C  offered at D/C  and Declined W/C at D/C and was escorted to lobby by RN.  

## 2019-04-21 NOTE — Discharge Instructions (Signed)
It was my pleasure taking care of you today!  Stay very well hydrated with plenty of water throughout the day. Please take antibiotics until completion.  Follow up with OBGYN, primary care physician or health department in 1 week for recheck of ongoing symptoms. You were tested for gonorrhea and chlamydia today. These results will take 3 days to return. You will be notified if they are positive. You can log in to mychart to view the results in a few days as well.   Please seek immediate care if you develop the following: Your symptoms are no better or worse in 3 days. There is severe back pain or lower abdominal pain.  You develop a fever There is nausea or vomiting.  There is continued burning or discomfort with urination after antibiotics are completed. New or worsening symptoms develop or you have any additional concerns.

## 2019-04-22 LAB — GC/CHLAMYDIA PROBE AMP (~~LOC~~) NOT AT ARMC
Chlamydia: NEGATIVE
Neisseria Gonorrhea: NEGATIVE

## 2019-08-22 ENCOUNTER — Encounter: Payer: Self-pay | Admitting: Family Medicine

## 2019-08-22 ENCOUNTER — Ambulatory Visit: Payer: Self-pay | Admitting: Family Medicine

## 2019-09-06 ENCOUNTER — Other Ambulatory Visit (HOSPITAL_COMMUNITY)
Admission: RE | Admit: 2019-09-06 | Discharge: 2019-09-06 | Disposition: A | Discharge status: Home / Self Care | Payer: Self-pay | Source: Ambulatory Visit | Attending: Family Medicine | Admitting: Family Medicine

## 2019-09-06 ENCOUNTER — Encounter: Payer: Self-pay | Admitting: Family Medicine

## 2019-09-06 ENCOUNTER — Other Ambulatory Visit: Payer: Self-pay

## 2019-09-06 ENCOUNTER — Ambulatory Visit (INDEPENDENT_AMBULATORY_CARE_PROVIDER_SITE_OTHER): Payer: Self-pay | Admitting: Family Medicine

## 2019-09-06 VITALS — BP 110/74 | HR 100

## 2019-09-06 DIAGNOSIS — N898 Other specified noninflammatory disorders of vagina: Secondary | ICD-10-CM

## 2019-09-06 DIAGNOSIS — R87612 Low grade squamous intraepithelial lesion on cytologic smear of cervix (LGSIL): Secondary | ICD-10-CM

## 2019-09-06 DIAGNOSIS — R1030 Lower abdominal pain, unspecified: Secondary | ICD-10-CM | POA: Insufficient documentation

## 2019-09-06 LAB — POCT UA - MICROSCOPIC ONLY: Epithelial cells, urine per micros: >20

## 2019-09-06 LAB — POCT URINALYSIS DIP (MANUAL ENTRY)
Glucose, UA: NEGATIVE mg/dL
Nitrite, UA: NEGATIVE
Protein Ur, POC: 100 mg/dL — AB
Spec Grav, UA: >=1.03 — AB (ref 1.010–1.025)
Urobilinogen, UA: 1 E.U./dL
pH, UA: 6 (ref 5.0–8.0)

## 2019-09-06 LAB — POCT WET PREP (WET MOUNT)
Clue Cells Wet Prep Whiff POC: NEGATIVE
Trichomonas Wet Prep HPF POC: ABSENT
WBC, Wet Prep HPF POC: >20

## 2019-09-06 MED ORDER — CEFTRIAXONE SODIUM 250 MG IJ SOLR
250.0000 mg | Freq: Once | INTRAMUSCULAR | Status: AC
  Administered 2019-09-06: 11:00:00 250 mg via INTRAMUSCULAR

## 2019-09-06 MED ORDER — FLUCONAZOLE 150 MG PO TABS: 150.0000 mg | ORAL_TABLET | Freq: Every day | ORAL | 2 refills | Status: DC

## 2019-09-06 MED ORDER — AZITHROMYCIN 500 MG PO TABS
500.0000 mg | ORAL_TABLET | Freq: Once | ORAL | Status: AC
  Administered 2019-09-06: 11:00:00 500 mg via ORAL

## 2019-09-06 NOTE — Progress Notes (Addendum)
Subjective:    Patient ID: Kathleen Alexander is a 23 y.o. female presenting with Abdominal Pain (low abdominal pain, unable to void) and Vaginal Discharge  on 09/06/2019  HPI: 3 days of lower abdominal pain. Big cramp which would not go away. Worse with movement. Had trouble peeing. Has had some hesitancy, and constipation. No blood in urine. Some burning with urination. Yellow discharge. Is able to eat. No fevers, no N/V.  Review of Systems  Constitutional: Negative for chills and fever.  Respiratory: Negative for shortness of breath.   Cardiovascular: Negative for chest pain.  Gastrointestinal: Negative for abdominal pain, nausea and vomiting.  Genitourinary: Negative for dysuria.  Skin: Negative for rash.      Objective:    BP 110/74   Pulse 100   LMP 08/23/2019 (Exact Date)   SpO2 99%  Physical Exam Exam conducted with a chaperone present.  Constitutional:      General: She is not in acute distress.    Appearance: She is well-developed.  HENT:     Head: Normocephalic and atraumatic.  Eyes:     General: No scleral icterus. Neck:     Musculoskeletal: Neck supple.  Cardiovascular:     Rate and Rhythm: Normal rate.  Pulmonary:     Effort: Pulmonary effort is normal.  Abdominal:     Palpations: Abdomen is soft.  Genitourinary:    General: Normal vulva.     Vagina: Vaginal discharge present.     Cervix: Cervical motion tenderness present.     Uterus: Tender.      Adnexa:        Right: Tenderness present.        Left: Tenderness present.   Skin:    General: Skin is warm and dry.  Neurological:     Mental Status: She is alert and oriented to person, place, and time.    Urinalysis    Component Value Date/Time   COLORURINE YELLOW 04/21/2019 0906   APPEARANCEUR CLOUDY (A) 04/21/2019 0906   LABSPEC 1.015 04/21/2019 0906   PHURINE 6.0 04/21/2019 0906   GLUCOSEU NEGATIVE 04/21/2019 0906   HGBUR LARGE (A) 04/21/2019 0906   BILIRUBINUR small (A) 09/06/2019 1100    BILIRUBINUR NEG 10/21/2016 0949   KETONESUR >= (160) (A) 09/06/2019 1100   KETONESUR NEGATIVE 04/21/2019 0906   PROTEINUR =100 (A) 09/06/2019 1100   PROTEINUR NEGATIVE 04/21/2019 0906   UROBILINOGEN 1.0 09/06/2019 1100   UROBILINOGEN 2.0 (H) 01/01/2014 1032   NITRITE Negative 09/06/2019 1100   NITRITE NEGATIVE 04/21/2019 0906   LEUKOCYTESUR Small (1+) (A) 09/06/2019 1100   LEUKOCYTESUR LARGE (A) 04/21/2019 0906   Source Wet Prep POC vaginal   WBC, Wet Prep HPF POC >20   Bacteria Wet Prep HPF POC ModerateAbnormal    Clue Cells Wet Prep HPF POC None   Clue Cells Wet Prep Whiff POC Negative Whiff   Yeast Wet Prep HPF POC ModerateAbnormal    KOH Wet Prep POC FewAbnormal    Trichomonas Wet Prep HPF POC Absent         Assessment & Plan:   Problem List Items Addressed This Visit      Unprioritized   Low grade squamous intraepithelial lesion (LGSIL) on cervical Pap smear    Needs f/u pap when not acutely infected       Other Visit Diagnoses    Vaginal discharge    -  Primary   treat yeast as well.   Relevant Medications   cefTRIAXone (ROCEPHIN)  injection 250 mg (Completed)   azithromycin (ZITHROMAX) tablet 500 mg (Completed)   fluconazole (DIFLUCAN) 150 MG tablet   Other Relevant Orders   POCT urinalysis dipstick (Completed)   POCT Wet Prep [facility]) (Completed)   Cervicovaginal ancillary only   Lower abdominal pain       suspect PID-->treated presumptively--safe sex and contraception discussed as well.   Relevant Orders   POCT urinalysis dipstick (Completed)   POCT Wet Prep [facility]) (Completed)   Cervicovaginal ancillary only   POCT UA - Microscopic Only (Completed)      Total face-to-face time with patient: 25 minutes. Over 50% of encounter was spent on counseling and coordination of care. Return if symptoms worsen or fail to improve.  Donnamae Jude 09/06/2019 4:45 PM

## 2019-09-06 NOTE — Assessment & Plan Note (Signed)
Needs f/u pap when not acutely infected

## 2019-09-07 LAB — CERVICOVAGINAL ANCILLARY ONLY
Chlamydia: POSITIVE — AB
Neisseria Gonorrhea: POSITIVE — AB

## 2019-09-11 MED ORDER — AZITHROMYCIN 500 MG PO TABS
500.0000 mg | ORAL_TABLET | Freq: Once | ORAL | Status: AC
  Administered 2019-09-06: 10:00:00 500 mg via ORAL

## 2019-09-11 NOTE — Addendum Note (Signed)
Addended by: Valerie Roys on: 09/11/2019 02:42 PM   Modules accepted: Orders

## 2019-12-13 ENCOUNTER — Other Ambulatory Visit: Payer: Self-pay

## 2019-12-13 ENCOUNTER — Encounter: Payer: Self-pay | Admitting: Family Medicine

## 2019-12-13 ENCOUNTER — Ambulatory Visit (INDEPENDENT_AMBULATORY_CARE_PROVIDER_SITE_OTHER): Payer: Self-pay | Admitting: Family Medicine

## 2019-12-13 VITALS — BP 110/78

## 2019-12-13 DIAGNOSIS — R319 Hematuria, unspecified: Secondary | ICD-10-CM

## 2019-12-13 LAB — POCT UA - MICROSCOPIC ONLY
Epithelial cells, urine per micros: >20
Trichomonas, UA: POSITIVE

## 2019-12-13 LAB — POCT URINALYSIS DIP (MANUAL ENTRY)
Bilirubin, UA: NEGATIVE
Glucose, UA: NEGATIVE mg/dL
Ketones, POC UA: NEGATIVE mg/dL
Nitrite, UA: NEGATIVE
Spec Grav, UA: 1.025 (ref 1.010–1.025)
Urobilinogen, UA: 1 E.U./dL
pH, UA: 7 (ref 5.0–8.0)

## 2019-12-13 MED ORDER — SULFAMETHOXAZOLE-TRIMETHOPRIM 800-160 MG PO TABS: 1.0000 | ORAL_TABLET | Freq: Two times a day (BID) | ORAL | 0 refills | Status: AC

## 2019-12-13 MED ORDER — METRONIDAZOLE 500 MG PO TABS: 500.0000 mg | ORAL_TABLET | Freq: Two times a day (BID) | ORAL | 0 refills | Status: DC

## 2019-12-13 NOTE — Patient Instructions (Signed)
It was nice to meet you today,   It is likely you have a UTI.  I will send off a urine culture to confirm it, but in the meantime I will prescribe you an antibiotic you can take twice a day for three days.  If after three days your pain with urination does not resolve let us know.  Or if you are still seeing blood when you urinate.    Have a great day,   Clemetine Marker, MD  Sulfamethoxazole; Trimethoprim, SMX-TMP tablets What is this medicine? SULFAMETHOXAZOLE; TRIMETHOPRIM or SMX-TMP (suhl fuh meth OK suh zohl; trye METH oh prim) is a combination of a sulfonamide antibiotic and a second antibiotic, trimethoprim. It is used to treat or prevent certain kinds of bacterial infections. It will not work for colds, flu, or other viral infections. This medicine may be used for other purposes; ask your health care provider or pharmacist if you have questions. COMMON BRAND NAME(S): Bacter-Aid DS, Bactrim, Bactrim DS, Septra, Septra DS What should I tell my health care provider before I take this medicine? They need to know if you have any of these conditions:  anemia  asthma  being treated with anticonvulsants  if you frequently drink alcohol containing drinks  kidney disease  liver disease  low level of folic acid or [id number] dehydrogenase  poor nutrition or malabsorption  porphyria  severe allergies  thyroid disorder  an unusual or allergic reaction to sulfamethoxazole, trimethoprim, sulfa drugs, other medicines, foods, dyes, or preservatives  pregnant or trying to get pregnant  breast-feeding How should I use this medicine? Take this medicine by mouth with a full glass of water. Follow the directions on the prescription label. Take your medicine at regular intervals. Do not take it more often than directed. Do not skip doses or stop your medicine early. Talk to your pediatrician regarding the use of this medicine in children. Special care may be needed. This medicine has  been used in children as young as 45 months of age. Overdosage: If you think you have taken too much of this medicine contact a poison control center or emergency room at once. NOTE: This medicine is only for you. Do not share this medicine with others. What if I miss a dose? If you miss a dose, take it as soon as you can. If it is almost time for your next dose, take only that dose. Do not take double or extra doses. What may interact with this medicine? Do not take this medicine with any of the following medications:  aminobenzoate potassium  dofetilide  metronidazole This medicine may also interact with the following medications:  ACE inhibitors like benazepril, enalapril, lisinopril, and ramipril  birth control pills  cyclosporine  digoxin  diuretics  indomethacin  medicines for diabetes  methenamine  methotrexate  phenytoin  potassium supplements  pyrimethamine  sulfinpyrazone  tricyclic antidepressants  warfarin This list may not describe all possible interactions. Give your health care provider a list of all the medicines, herbs, non-prescription drugs, or dietary supplements you use. Also tell them if you smoke, drink alcohol, or use illegal drugs. Some items may interact with your medicine. What should I watch for while using this medicine? Tell your doctor or health care professional if your symptoms do not improve. Drink several glasses of water a day to reduce the risk of kidney problems. Do not treat diarrhea with over the counter products. Contact your doctor if you have diarrhea that lasts more than 2 days  or if it is severe and watery. This medicine can make you more sensitive to the sun. Keep out of the sun. If you cannot avoid being in the sun, wear protective clothing and use a sunscreen. Do not use sun lamps or tanning beds/booths. What side effects may I notice from receiving this medicine? Side effects that you should report to your doctor or  health care professional as soon as possible:  allergic reactions like skin rash or hives, swelling of the face, lips, or tongue  breathing problems  fever or chills, sore throat  irregular heartbeat, chest pain  joint or muscle pain  pain or difficulty passing urine  red pinpoint spots on skin  redness, blistering, peeling or loosening of the skin, including inside the mouth  unusual bleeding or bruising  unusually weak or tired  yellowing of the eyes or skin Side effects that usually do not require medical attention (report to your doctor or health care professional if they continue or are bothersome):  diarrhea  dizziness  headache  loss of appetite  nausea, vomiting  nervousness This list may not describe all possible side effects. Call your doctor for medical advice about side effects. You may report side effects to FDA at 1-800-FDA-1088. Where should I keep my medicine? Keep out of the reach of children. Store at room temperature between 20 to 25 degrees C (68 to 77 degrees F). Protect from light. Throw away any unused medicine after the expiration date. NOTE: This sheet is a summary. It may not cover all possible information. If you have questions about this medicine, talk to your doctor, pharmacist, or health care provider.  2020 Elsevier/Gold Standard (2013-07-19 14:38:26)

## 2019-12-13 NOTE — Progress Notes (Signed)
   [facility] Phone: [phone]     Kathleen Alexander - 23 y.o. female MRN [id number]  Date of birth: July 16, 1996  Subjective:   cc: hematuria  HPI:  Blood in urine: started two days prior to this.  Every time she urinated it occurred. Normal color of urine.  Red spots on TP only, none seen in the toilet.  Period started 6-7 days ago, ended on Tuesday, bleeding started the day after that. .  Moderate period.  Uses pad.  Never happened before.  Small burning sensation when she urinates. Still having burning sensation.  No odor.  Having to urinate a lot, every 15 minutes. Drank two twenty oz of water yesterday. Last time she had sexual intercourse was before her period.  No vaginal discharge, no abdominal pain, no fever.  Used condom.    No medication  ROS: See HPI for pertinent positives and negatives  Family history reviewed for today's visit. No changes.  Social history- patient is a marijuana smoker.    Objective:   BP 110/78   LMP 12/05/2019 (Exact Date)  Gen: alert and oriented. No acute distress.   GI: soft, nontender. Normal bowel sounds.  GU: normal appearing vagina.  No lesions/erythema/bleeding seen on the labia, vaginal vault, or cervix.    Assessment/Plan:   Hematuria Spotting on toilet paper occurred day after her period ended.  Some dysuria, increased frequency. No source of bleeding seen on physical exam with speculum.  Urinalysis showed hematuria and LE, possibly indicative of UTI.  Trichomonas seen on microscopic exam of urine sample.  Could be the cause of her blood, but most likely it is residual bleeding from her menstrual cycle.  Advised her to have partner treated for trich as well.   - bactrim for possible UTI - flagyl for trich - f/u urine culture   Clemetine Marker, MD PGY-2 [facility]

## 2019-12-15 LAB — URINE CULTURE

## 2019-12-16 DIAGNOSIS — R319 Hematuria, unspecified: Secondary | ICD-10-CM | POA: Insufficient documentation

## 2019-12-16 NOTE — Assessment & Plan Note (Signed)
Spotting on toilet paper occurred day after her period ended.  Some dysuria, increased frequency. No source of bleeding seen on physical exam with speculum.  Urinalysis showed hematuria and LE, possibly indicative of UTI.  Trichomonas seen on microscopic exam of urine sample.  Could be the cause of her blood, but most likely it is residual bleeding from her menstrual cycle.  Advised her to have partner treated for trich as well.   - bactrim for possible UTI - flagyl for trich - f/u urine culture

## 2020-01-21 ENCOUNTER — Ambulatory Visit (INDEPENDENT_AMBULATORY_CARE_PROVIDER_SITE_OTHER): Payer: Self-pay | Admitting: Family Medicine

## 2020-01-21 ENCOUNTER — Other Ambulatory Visit: Payer: Self-pay

## 2020-01-21 VITALS — BP 110/80 | HR 79 | Wt 156.2 lb

## 2020-01-21 DIAGNOSIS — K047 Periapical abscess without sinus: Secondary | ICD-10-CM | POA: Insufficient documentation

## 2020-01-21 DIAGNOSIS — K05219 Aggressive periodontitis, localized, unspecified severity: Secondary | ICD-10-CM

## 2020-01-21 MED ORDER — AMOXICILLIN-POT CLAVULANATE 875-125 MG PO TABS: 1.0000 | ORAL_TABLET | Freq: Two times a day (BID) | ORAL | 0 refills | Status: DC

## 2020-01-21 NOTE — Assessment & Plan Note (Signed)
Patient with very poor dentition and able to express purulence from left upper gumline.  Will treat with Augmentin 875/125 twice daily for 10 days.  Gave patient list of dentists in the area who take self-pay patients.  Follow-up as needed.

## 2020-01-21 NOTE — Progress Notes (Signed)
   HPI 24 year old female who presents for left-sided mouth and jaw swelling.  States that she woke up on 01/19/2020 with the side of swelling as well as some very mild pain in her upper gums.  Patient had some leftover "penicillin" which she took and states that this helped her swelling quite a bit.  Patient has known poor dentition and is seeking out a dentist.  CC: Mouth and jaw swelling   ROS:   Review of Systems See HPI for ROS.   CC, SH/smoking status, and VS noted  Objective: BP 110/80   Pulse 79   Wt 156 lb 3.2 oz (70.9 kg)   LMP 01/15/2020   SpO2 100%   BMI 25.21 kg/m  Gen: 24 year old African-American female, no acute distress, pleasant HEENT: Mild hyperpigmented upper gumline.  Very mild tenderness to palpation upper gum.  Able to express a very minimal amount of purulence from point of maximal tenderness.  No bleeding noted.  Left-sided jaw swelling as compared to right CV: Skin warm and dry Resp: No accessory muscle use Neuro: Alert and oriented, Speech clear, No gross deficits   Assessment and plan:  Tooth abscess Patient with very poor dentition and able to express purulence from left upper gumline.  Will treat with Augmentin 875/125 twice daily for 10 days.  Gave patient list of dentists in the area who take self-pay patients.  Follow-up as needed.   No orders of the defined types were placed in this encounter.   Meds ordered this encounter  Medications  . amoxicillin-clavulanate (AUGMENTIN) 875-125 MG tablet    Sig: Take 1 tablet by mouth 2 (two) times daily.    Dispense:  20 tablet    Refill:  0    Guadalupe Dawn MD PGY-3 Family Medicine Resident  01/21/2020 3:39 PM

## 2020-01-21 NOTE — Patient Instructions (Signed)
It was great to meet you! It looks like you have a gum infection.  I will send you an antibiotic called Augmentin which you will take 2 times a day for 10 days.  Make sure you take this with food.  I gave you a list of dentists in the area that see patients without insurance or with Medicaid.  I would try and get set up with one of them.

## 2020-01-22 ENCOUNTER — Emergency Department (HOSPITAL_COMMUNITY)
Admission: EM | Admit: 2020-01-22 | Discharge: 2020-01-22 | Disposition: A | Discharge status: Home / Self Care | Payer: Self-pay | Attending: Emergency Medicine | Admitting: Emergency Medicine

## 2020-01-22 ENCOUNTER — Encounter (HOSPITAL_COMMUNITY): Payer: Self-pay | Admitting: Emergency Medicine

## 2020-01-22 ENCOUNTER — Telehealth: Payer: Self-pay

## 2020-01-22 ENCOUNTER — Other Ambulatory Visit: Payer: Self-pay

## 2020-01-22 DIAGNOSIS — K047 Periapical abscess without sinus: Secondary | ICD-10-CM | POA: Insufficient documentation

## 2020-01-22 MED ORDER — LIDOCAINE-EPINEPHRINE (PF) 2 %-1:200000 IJ SOLN
10.0000 mL | Freq: Once | INTRAMUSCULAR | Status: AC
  Administered 2020-01-22: 10 mL via INTRADERMAL
  Filled 2020-01-22: qty 20

## 2020-01-22 MED ORDER — NAPROXEN 500 MG PO TABS: 500.0000 mg | ORAL_TABLET | Freq: Two times a day (BID) | ORAL | 0 refills | Status: DC | PRN

## 2020-01-22 MED ORDER — ACETAMINOPHEN 325 MG PO TABS
650.0000 mg | ORAL_TABLET | Freq: Once | ORAL | Status: AC
  Administered 2020-01-22: 650 mg via ORAL
  Filled 2020-01-22: qty 2
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
## 2020-01-22 NOTE — Telephone Encounter (Signed)
Pt calls nurse line in regards to increased facial swelling since yesterday. Patient was seen in the office on Tuesday 1/26, by Dr. Kris Mouton for abscess in mouth. Per patient,she started taking medication last night. She woke up this morning and noticed significant swelling on left side of face and going up towards her eye. Pt states that she is beginning to have drainage from eye.   Pt declines difficulty breathing and shortness of breath.   No appointments available for this afternoon.  Advised patient to be evaluated in urgent care or ED. Pt resistant toward going to urgent care or ED. Informed patient that I was uncomfortable advising her to wait to be seen tomorrow due to significant swelling.   Pt states that she guesses she will go to ED, line disconnected.   To PCP and Dr. Danton Sewer, RN

## 2020-01-22 NOTE — ED Triage Notes (Signed)
Pt started having left sided upper dental pain 3 days ago and a lump on the inside of her mouth- pt was seen yesterday for this and placed on a antibiotic but woke up with swelling on left side of face.

## 2020-01-22 NOTE — Discharge Instructions (Signed)
Please continue to take your Augmentin, as prescribed.  Please also take naproxen that I am prescribing today, as directed.  Please discontinue immediately should you become pregnant.  Please swish with salt water for 30 seconds 5-6 times per day.  I encourage you to read the instructions on dental abscesses as well as local dental resources.  You need to get established with a dentist for definitive intervention and management.   It is imperative that you follow-up with your primary care provider in 2 to 3 days for reevaluation.  Return to the ED or seek medical immediate medical attention should you develop any difficulty breathing or swallowing, difficulty opening her mouth, fevers or chills, or any other new or worsening symptoms.

## 2020-01-29 ENCOUNTER — Emergency Department (HOSPITAL_COMMUNITY)
Admission: EM | Admit: 2020-01-29 | Discharge: 2020-01-30 | Disposition: A | Discharge status: Home / Self Care | Payer: Self-pay | Attending: Emergency Medicine | Admitting: Emergency Medicine

## 2020-01-29 ENCOUNTER — Encounter (HOSPITAL_COMMUNITY): Payer: Self-pay

## 2020-01-29 ENCOUNTER — Other Ambulatory Visit: Payer: Self-pay

## 2020-01-29 DIAGNOSIS — R102 Pelvic and perineal pain: Secondary | ICD-10-CM | POA: Insufficient documentation

## 2020-01-29 DIAGNOSIS — Z5321 Procedure and treatment not carried out due to patient leaving prior to being seen by health care provider: Secondary | ICD-10-CM | POA: Insufficient documentation

## 2020-01-29 NOTE — ED Triage Notes (Signed)
Onset yesterday vaginal swelling and pain.  No itching, discharge.

## 2020-01-29 NOTE — ED Notes (Signed)
Pt called x3 in the waiting room. No response. 

## 2020-01-30 ENCOUNTER — Other Ambulatory Visit: Payer: Self-pay

## 2020-01-30 ENCOUNTER — Emergency Department (HOSPITAL_COMMUNITY)
Admission: EM | Admit: 2020-01-30 | Discharge: 2020-01-30 | Disposition: A | Discharge status: Home / Self Care | Payer: Self-pay | Attending: Emergency Medicine | Admitting: Emergency Medicine

## 2020-01-30 DIAGNOSIS — N9489 Other specified conditions associated with female genital organs and menstrual cycle: Secondary | ICD-10-CM | POA: Insufficient documentation

## 2020-01-30 LAB — URINALYSIS, ROUTINE W REFLEX MICROSCOPIC
Bilirubin Urine: NEGATIVE
Glucose, UA: NEGATIVE mg/dL
Hgb urine dipstick: NEGATIVE
Ketones, ur: NEGATIVE mg/dL
Nitrite: NEGATIVE
Protein, ur: NEGATIVE mg/dL
Specific Gravity, Urine: 1.027 (ref 1.005–1.030)
pH: 5 (ref 5.0–8.0)

## 2020-01-30 LAB — WET PREP, GENITAL
Clue Cells Wet Prep HPF POC: NONE SEEN
Sperm: NONE SEEN
Trich, Wet Prep: NONE SEEN
WBC, Wet Prep HPF POC: NONE SEEN
Yeast Wet Prep HPF POC: NONE SEEN

## 2020-01-30 LAB — HIV ANTIBODY (ROUTINE TESTING W REFLEX): HIV Screen 4th Generation wRfx: NONREACTIVE

## 2020-01-30 MED ORDER — LIDOCAINE 4 % EX CREA: 1.0000 "application " | TOPICAL_CREAM | CUTANEOUS | 0 refills | Status: DC | PRN

## 2020-01-30 MED ORDER — FLUCONAZOLE 150 MG PO TABS: 150.0000 mg | ORAL_TABLET | Freq: Every day | ORAL | 0 refills | Status: AC

## 2020-01-30 MED ORDER — FLUCONAZOLE 150 MG PO TABS
150.0000 mg | ORAL_TABLET | Freq: Once | ORAL | Status: AC
  Administered 2020-01-30: 150 mg via ORAL
  Filled 2020-01-30: qty 1

## 2020-01-30 NOTE — ED Notes (Signed)
Pt dc'd home w/all belongings, a/ox4, ambulatory on dc

## 2020-01-30 NOTE — Social Work (Signed)
EDCSW met with Pt at bedside. Discussed availability of appointment with PCP. CSW gave resources for [employer]. Pt expressed appreciation.
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
## 2020-01-30 NOTE — ED Triage Notes (Signed)
Pt c.o vaginal swelling for the past 2 days, denies itching or discharge, not currently sexually active,

## 2020-01-30 NOTE — Discharge Instructions (Addendum)
°  Fluconazole: There is no definite finding of yeast on the wet prep, however, since you have had similar instances that have resolved with yeast infection treatment, we will try some of this treatment this time.  You were given the first dose here in the ED.  If you are still having symptoms in 3 days, take the second dose.  Lidocaine cream: Apply to the painful area, as needed, up to 4 times a day.  Follow-up: Follow-up with OB/GYN as soon as possible on this matter.

## 2020-01-31 ENCOUNTER — Ambulatory Visit: Payer: Self-pay | Admitting: Family Medicine

## 2020-01-31 LAB — RPR: RPR Ser Ql: NONREACTIVE

## 2020-02-03 LAB — GC/CHLAMYDIA PROBE AMP (~~LOC~~) NOT AT ARMC
Chlamydia: NEGATIVE
Neisseria Gonorrhea: NEGATIVE

## 2020-02-04 LAB — URINE CULTURE: Culture: NO GROWTH

## 2020-10-27 ENCOUNTER — Emergency Department (HOSPITAL_COMMUNITY)
Admission: EM | Admit: 2020-10-27 | Discharge: 2020-10-27 | Disposition: A | Discharge status: Home / Self Care | Payer: Self-pay | Attending: Emergency Medicine | Admitting: Emergency Medicine

## 2020-10-27 ENCOUNTER — Other Ambulatory Visit (HOSPITAL_COMMUNITY): Payer: Self-pay | Admitting: Physician Assistant

## 2020-10-27 ENCOUNTER — Encounter (HOSPITAL_COMMUNITY): Payer: Self-pay | Admitting: Emergency Medicine

## 2020-10-27 ENCOUNTER — Other Ambulatory Visit: Payer: Self-pay

## 2020-10-27 DIAGNOSIS — K0889 Other specified disorders of teeth and supporting structures: Secondary | ICD-10-CM | POA: Insufficient documentation

## 2020-10-27 MED ORDER — AMOXICILLIN-POT CLAVULANATE 875-125 MG PO TABS: 1.0000 | ORAL_TABLET | Freq: Two times a day (BID) | ORAL | 0 refills | Status: DC

## 2020-10-27 MED ORDER — PENICILLIN V POTASSIUM 500 MG PO TABS: 500.0000 mg | ORAL_TABLET | Freq: Four times a day (QID) | ORAL | 0 refills | Status: DC

## 2020-10-27 MED FILL — AMOX-CLAV 875-125 MG TABLET: 875-125 | 7 days supply | Qty: 14 | Fill #0

## 2020-10-27 NOTE — Discharge Instructions (Addendum)
Like we discussed, I am prescribing a medication called Augmentin. You are going to take this 2 times a day for the next week. Please do not stop taking this early.  I recommend a combination of tylenol and ibuprofen for management of your pain. You can take a low dose of both at the same time. I recommend 325 mg of Tylenol combined with 400 mg of ibuprofen. This is one regular Tylenol and two regular ibuprofen. You can take these 2-3 times per day for your pain. Please try to take these medications with a small amount of food as well to prevent upsetting your stomach.  Please follow-up with Dr. Benson Norway regarding your dental problems. If you develop any new or worsening symptoms, please return to the emergency department for reevaluation. It was a pleasure to meet you.
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
## 2020-10-27 NOTE — ED Triage Notes (Addendum)
Patient states her lower jaw is swollen bilaterally, has broken molars that need oral sx but has no insurance. Right side started swelling on Sunday and the left side this morning. Would also like a referral to an oral sx.

## 2020-10-28 ENCOUNTER — Ambulatory Visit: Payer: Self-pay | Admitting: Family Medicine

## 2021-02-01 ENCOUNTER — Other Ambulatory Visit: Payer: Self-pay

## 2021-02-01 ENCOUNTER — Other Ambulatory Visit (HOSPITAL_COMMUNITY)
Admission: RE | Admit: 2021-02-01 | Discharge: 2021-02-01 | Disposition: A | Discharge status: Home / Self Care | Payer: Self-pay | Source: Ambulatory Visit | Attending: Family Medicine | Admitting: Family Medicine

## 2021-02-01 ENCOUNTER — Other Ambulatory Visit: Payer: Self-pay | Admitting: Family Medicine

## 2021-02-01 ENCOUNTER — Encounter: Payer: Self-pay | Admitting: Family Medicine

## 2021-02-01 ENCOUNTER — Ambulatory Visit (INDEPENDENT_AMBULATORY_CARE_PROVIDER_SITE_OTHER): Payer: Self-pay | Admitting: Family Medicine

## 2021-02-01 VITALS — BP 120/60 | HR 92 | Ht 66.0 in | Wt 162.0 lb

## 2021-02-01 DIAGNOSIS — Z0000 Encounter for general adult medical examination without abnormal findings: Secondary | ICD-10-CM

## 2021-02-01 DIAGNOSIS — B353 Tinea pedis: Secondary | ICD-10-CM

## 2021-02-01 DIAGNOSIS — K029 Dental caries, unspecified: Secondary | ICD-10-CM

## 2021-02-01 DIAGNOSIS — Z124 Encounter for screening for malignant neoplasm of cervix: Secondary | ICD-10-CM | POA: Insufficient documentation

## 2021-02-01 MED ORDER — TERBINAFINE HCL 250 MG PO TABS: 250.0000 mg | ORAL_TABLET | Freq: Every day | ORAL | 0 refills | Status: DC

## 2021-02-01 MED ORDER — AMOXICILLIN-POT CLAVULANATE 875-125 MG PO TABS: 1.0000 | ORAL_TABLET | Freq: Two times a day (BID) | ORAL | 0 refills | Status: DC

## 2021-02-01 MED ORDER — TERBINAFINE 1 % EX GEL: 1.0000 "application " | Freq: Two times a day (BID) | CUTANEOUS | 0 refills | Status: DC

## 2021-02-01 MED FILL — AMOX-CLAV 875-125 MG TABLET: 875-125 | 7 days supply | Qty: 14 | Fill #0

## 2021-02-01 MED FILL — TERBINAFINE HCL 250 MG TABS: 250 | 7 days supply | Qty: 7 | Fill #0

## 2021-02-01 NOTE — Progress Notes (Signed)
    SUBJECTIVE:   CHIEF COMPLAINT / HPI: Physical, pap smear  No regular exercise. Denies tobacco use. Smokes marijuana about 3x/week. Denies alcohol use. Feels safe in relationship.  Pap smear Here for pap smear. Last pap 02/2017 with LSIL.  Foot peeling Right foot discoloration with dryness on bottom of foot for 2 years. Sometimes itchy or burning. Has been using OTC foot creams.  Gum swelling Gums swelling for 2 months on right upper (two back teeth), getting worse. Was told they have to be surgically removed.  She has seen a dentist recently, but states she never got a referral for oral surgeon from her dentist.  Recently went to [facility] ED 3-4 months ago, got some amoxicillin for 1 week. Has been using salt water. Sometimes gets bumps around gums. Asking for more antibiotics until she can see an oral surgeon.  PERTINENT  PMH / PSH: LSIL  OBJECTIVE:   BP 120/60   Pulse 92   Ht 5\' 6"  (1.676 m)   Wt 162 lb (73.5 kg)   LMP 01/09/2021   SpO2 96%   BMI 26.15 kg/m   General: Alert young female, NAD HEENT: Right upper 1st and 2nd molars broken and decaying Derm: Right foot plantar surface with scattered patches of hyperpigmentation and scale affecting the interdigital spaces, see image below:     ASSESSMENT/PLAN:   Screening for cervical cancer LSIL noted 2018. Pap smear performed today, further workup pending results.  Tinea pedis Noted on right foot. - terbinafine cream BID x 4 weeks - PO terbinafine x 7 days  Dental decay To right upper 1st and 2nd molars ongoing for months requiring surgical intervention. Poor dentition overall. We will go ahead and give her another course of antibiotics until she can see an oral Psychologist, sport and exercise. - amoxicillin-clavulanate BID x 7 days - dental resources given  Zola Button, MD [place]

## 2021-02-01 NOTE — Patient Instructions (Addendum)
It was nice seeing you today!  I have sent a prescription for 7 more days of antibiotics. It is important that you see an oral surgeon as soon as possible.  For your fungal infection, I am prescribing you a pill to take for 7 days and a cream to use twice a day for the next 4 weeks. Give [place] a call if this does not resolve your symptoms.  I will update you with the results of your Pap smear.  Stay well, Zola Button, MD [place] [phone]

## 2021-02-02 LAB — CYTOLOGY - PAP: Diagnosis: NEGATIVE

## 2021-06-09 ENCOUNTER — Other Ambulatory Visit: Payer: Self-pay

## 2021-06-09 ENCOUNTER — Ambulatory Visit (INDEPENDENT_AMBULATORY_CARE_PROVIDER_SITE_OTHER): Payer: Self-pay | Admitting: Family Medicine

## 2021-06-09 ENCOUNTER — Encounter: Payer: Self-pay | Admitting: Family Medicine

## 2021-06-09 VITALS — BP 108/62 | HR 85 | Temp 99.0°F | Ht 65.5 in | Wt 159.6 lb

## 2021-06-09 DIAGNOSIS — K029 Dental caries, unspecified: Secondary | ICD-10-CM | POA: Insufficient documentation

## 2021-06-09 MED ORDER — AMOXICILLIN-POT CLAVULANATE 875-125 MG PO TABS
1.0000 | ORAL_TABLET | Freq: Two times a day (BID) | ORAL | 0 refills | Status: AC
  Filled 2021-06-09: qty 14, 7d supply, fill #0

## 2021-06-09 NOTE — Progress Notes (Signed)
    SUBJECTIVE:   CHIEF COMPLAINT / HPI:   "Needs medicine for gums": Pleasant patient with history of poor dentition, tooth abscess, and gum decay presents to clinic today with concerns for pain in her left maxillary region with mild swelling to her left cheek.  Patient reports that she felt a tender "bump" on her gums yesterday for used a warm/hot mouth rinse to help decrease the likelihood of infection.  She had swelling that started yesterday but today it has been better.  She put a cold compress on her left cheek to help decrease the swelling.  She also put Orajel on to her teeth but noticed that the pain was on her tooth that was rather than her gums.  She denies any other fevers, headaches, difficulty breathing, difficulty swallowing, bleeding from the gums, trauma to her mouth/teeth, body aches or chills.  PERTINENT  PMH / PSH:  Patient Active Problem List   Diagnosis Date Noted   Dental decay 06/09/2021   Tooth abscess 01/21/2020   Hematuria 12/16/2019   History of gonorrhea 03/22/2017   History of chlamydia 03/22/2017   Healthcare maintenance 03/15/2017     OBJECTIVE:   BP 108/62   Pulse 85   Temp 99 F (37.2 C)   Ht 5' 5.5" (1.664 m)   Wt 159 lb 9.6 oz (72.4 kg)   SpO2 98%   BMI 26.15 kg/m    Physical exam: General: Pleasant patient, nontoxic-appearing Mouth: No obvious sign of abscess appreciated in the mouth, mild gingivitis is appreciated especially to the left aspect of patient's mouth (maxillary and mandibular teeth), no bleeding or drainage appreciated at this time, left cheek does appear indurated Respiratory: Comfortable work of breathing on room air  ASSESSMENT/PLAN:   Dental decay/Gingivitis:  1. Take Augmentin 1 tablet twice daily for 7 days. 2. Patient given form for dentist offices - instructed to call and ask about out-of-pocket pricing for evaluation/cleaning. Continue to brush/floss twice daily.  3. Look at the [facility] app for reducing medication  costs.  4. Call the clinic at [phone] if symptoms worsen or you have any concerns.  Go to emergency department if swelling worsens and involves the eyes.   [place]

## 2021-06-09 NOTE — Patient Instructions (Signed)
Thank you for coming in to see [place] today! Please see below to review our plan for today's visit:  1. Take Augmentin 1 tablet twice daily for 7 days. 2. Patient given form for dentist offices - instructed to call and ask about out-of-pocket pricing for evaluation/cleaning. Continue to brush/floss twice daily.  3. Look at the [facility] app for reducing medication costs.  4. Call the clinic at [phone] if symptoms worsen or you have any concerns.  Go to emergency department if swelling worsens and involves the eyes.  Please call the clinic at [phone] if your symptoms worsen or you have any concerns. It was our pleasure to serve you!   Dr. Milus Banister [facility]

## 2021-06-10 ENCOUNTER — Other Ambulatory Visit: Payer: Self-pay

## 2021-12-06 ENCOUNTER — Encounter: Payer: Self-pay | Admitting: Family Medicine

## 2021-12-06 ENCOUNTER — Other Ambulatory Visit: Payer: Self-pay

## 2021-12-06 ENCOUNTER — Ambulatory Visit (INDEPENDENT_AMBULATORY_CARE_PROVIDER_SITE_OTHER): Payer: Self-pay | Admitting: Family Medicine

## 2021-12-06 VITALS — BP 103/65 | HR 84 | Temp 98.4°F | Ht 65.5 in | Wt 161.4 lb

## 2021-12-06 DIAGNOSIS — R52 Pain, unspecified: Secondary | ICD-10-CM

## 2021-12-06 DIAGNOSIS — Z20822 Contact with and (suspected) exposure to covid-19: Secondary | ICD-10-CM

## 2021-12-06 LAB — POCT INFLUENZA A/B
Influenza A, POC: POSITIVE — AB
Influenza B, POC: NEGATIVE

## 2021-12-06 MED ORDER — ONDANSETRON 8 MG PO TBDP
8.0000 mg | ORAL_TABLET | Freq: Three times a day (TID) | ORAL | 0 refills | Status: DC | PRN
  Filled 2021-12-06: qty 20, 7d supply, fill #0

## 2021-12-06 NOTE — Patient Instructions (Signed)
You are positive for flu A.  I will be very important to make sure that we are able to stay hydrated, and sending in a medication for nausea to help you so you can make sure you are drinking enough water and able to take in enough to not get dehydrated.  For your body aches/pain you can take Tylenol and ibuprofen as needed, for severe pain you can take them both together.

## 2021-12-06 NOTE — Progress Notes (Signed)
    SUBJECTIVE:   CHIEF COMPLAINT / HPI:   Since yesterday morning, patient has had vomiting, cough, decreased appetite, chills, headache.  She does not know she has any fever because she does not have a thermometer at home.  She has been able to drink water somewhat but she does vomit with most of these attempts.  She did eat grapes last night.  She has not been taking any other medications that are prescribed, does note that she took some BC powder and Tylenol at home.    Patient reports she does not think that she would be pregnant her last period was on the 30th, she is not currently on birth control does not wish to be as well.  PERTINENT  PMH / PSH: Reviewed  OBJECTIVE:   BP 103/65   Pulse 84   Temp 98.4 F (36.9 C)   Ht 5' 5.5" (1.664 m)   Wt 161 lb 6.4 oz (73.2 kg)   LMP 11/24/2021   SpO2 100%   BMI 26.45 kg/m   Gen: appears mildly ill, NAD CV: RRR, no m/r/g appreciated, no peripheral edema Pulm: CTAB, no wheezes/crackles, no increased WOB GI: soft, non-tender, non-distended  ASSESSMENT/PLAN:   Influenza A Patient with <48-hour history of symptoms, tested positive for influenza A while in the clinic.  Discussed pros and cons of Tamiflu and ultimately decided with the patient to not proceed with Tamiflu.  Did prescribe patient Zofran 8 mg ODT every 8 hours as needed for nausea.  Discussed continued hydration and gave education regarding signs of dehydration.   Rise Patience, [place]

## 2021-12-07 ENCOUNTER — Other Ambulatory Visit: Payer: Self-pay

## 2021-12-07 LAB — NOVEL CORONAVIRUS, NAA: SARS-CoV-2, NAA: NOT DETECTED

## 2021-12-14 ENCOUNTER — Other Ambulatory Visit: Payer: Self-pay

## 2022-03-06 ENCOUNTER — Other Ambulatory Visit: Payer: Self-pay

## 2022-03-06 ENCOUNTER — Emergency Department (HOSPITAL_COMMUNITY)
Admission: EM | Admit: 2022-03-06 | Discharge: 2022-03-06 | Disposition: A | Discharge status: Home / Self Care | Payer: Self-pay | Attending: Emergency Medicine | Admitting: Emergency Medicine

## 2022-03-06 DIAGNOSIS — K029 Dental caries, unspecified: Secondary | ICD-10-CM | POA: Insufficient documentation

## 2022-03-06 DIAGNOSIS — K047 Periapical abscess without sinus: Secondary | ICD-10-CM | POA: Insufficient documentation

## 2022-03-06 MED ORDER — AMOXICILLIN-POT CLAVULANATE 875-125 MG PO TABS
1.0000 | ORAL_TABLET | Freq: Once | ORAL | Status: AC
  Administered 2022-03-06: 1 via ORAL
  Filled 2022-03-06: qty 1

## 2022-03-06 MED ORDER — LIDOCAINE VISCOUS HCL 2 % MT SOLN
15.0000 mL | OROMUCOSAL | 0 refills | Status: DC | PRN
  Filled 2022-03-06: qty 300, 20d supply, fill #0

## 2022-03-06 MED ORDER — CHLORHEXIDINE GLUCONATE 0.12 % MT SOLN
15.0000 mL | Freq: Two times a day (BID) | OROMUCOSAL | 0 refills | Status: DC
  Filled 2022-03-06: qty 120, 4d supply, fill #0

## 2022-03-06 MED ORDER — OXYCODONE-ACETAMINOPHEN 5-325 MG PO TABS
1.0000 | ORAL_TABLET | Freq: Once | ORAL | Status: AC
  Administered 2022-03-06: 1 via ORAL
  Filled 2022-03-06: qty 1

## 2022-03-06 MED ORDER — AMOXICILLIN-POT CLAVULANATE 875-125 MG PO TABS
1.0000 | ORAL_TABLET | Freq: Two times a day (BID) | ORAL | 0 refills | Status: DC
  Filled 2022-03-06: qty 14, 7d supply, fill #0

## 2022-03-06 MED ORDER — LIDOCAINE HCL (PF) 1 % IJ SOLN
10.0000 mL | Freq: Once | INTRAMUSCULAR | Status: AC
  Administered 2022-03-06: 10 mL
  Filled 2022-03-06: qty 10

## 2022-03-06 NOTE — ED Provider Notes (Signed)
?[facility] ?Provider Note ? ? ?CSN: [id number] ?Arrival date & time: 03/06/22  1349 ? ?  ? ?History ? ?Chief Complaint  ?Patient presents with  ? Facial Swelling  ? ? ?Kathleen Alexander is a 26 y.o. female with a past medical history of multiple dental abscesses.  Presents emergency department with a chief complaint of left facial swelling.  Patient reports that she started having dental pain yesterday.  Patient had relief of symptoms when using Orajel.  Patient states that she woke this morning and noticed swelling to the left side of her face. ? ?Denies any fever, chills, trismus, trouble swallowing, drooling, high potato voice, dyspnea, neck pain, neck stiffness. ? ?HPI ? ?  ? ?Home Medications ?Prior to Admission medications   ?Medication Sig Start Date End Date Taking? Authorizing Provider  ?lidocaine (LMX) 4 % cream Apply 1 application topically as needed. 01/30/20   Joy, Shawn C, PA-C  ?naproxen (NAPROSYN) 500 MG tablet Take 1 tablet (500 mg total) by mouth 2 (two) times daily between meals as needed for moderate pain (For discomfort and swelling.). 01/22/20   Corena Herter, PA-C  ?ondansetron (ZOFRAN-ODT) 8 MG disintegrating tablet Take 1 tablet (8 mg total) by mouth every 8 (eight) hours as needed for nausea or vomiting. 12/06/21   Lilland, Alana, DO  ?   ? ?Allergies    ?Patient has no known allergies.   ? ?Review of Systems   ?Review of Systems  ?Constitutional:  Negative for chills and fever.  ?HENT:  Positive for dental problem and facial swelling. Negative for drooling, sore throat and trouble swallowing.   ?Musculoskeletal:  Negative for neck pain and neck stiffness.  ?Psychiatric/Behavioral:  Negative for confusion.   ? ?Physical Exam ?Updated Vital Signs ?BP 109/63   Pulse 79   Temp 97.9 ?F (36.6 ?C)   Resp 14   LMP 02/11/2022 (Approximate)   SpO2 99%  ?Physical Exam ?Vitals and nursing note reviewed.  ?Constitutional:   ?   General: She is not in acute  distress. ?   Appearance: She is not ill-appearing, toxic-appearing or diaphoretic.  ?HENT:  ?   Head: Normocephalic.  ?   Jaw: Tenderness and swelling present. No trismus, pain on movement or malocclusion.  ?   Comments: Tenderness in facial swelling to left upper jaw ?   Mouth/Throat:  ?   Mouth: Mucous membranes are moist.  ?   Dentition: Abnormal dentition. Dental caries and dental abscesses present. No dental tenderness.  ?   Tongue: No lesions. Tongue does not deviate from midline.  ?   Palate: No mass and lesions.  ?   Pharynx: Oropharynx is clear. Uvula midline. No pharyngeal swelling, oropharyngeal exudate, posterior oropharyngeal erythema or uvula swelling.  ?   Tonsils: No tonsillar exudate or tonsillar abscesses. 1+ on the right. 1+ on the left.  ?   Comments: Small area of fluctuance and tenderness adjacent to #4 tooth.  Poor dentition with multiple dental caries.  Handle oral secretions without difficulty ?Eyes:  ?   General: No scleral icterus.    ?   Right eye: No discharge.     ?   Left eye: No discharge.  ?Neck:  ?   Comments: No swelling to submandibular space ?Cardiovascular:  ?   Rate and Rhythm: Normal rate.  ?Pulmonary:  ?   Effort: Pulmonary effort is normal.  ?Musculoskeletal:  ?   Cervical back: Normal range of motion and neck supple.  No edema, erythema, signs of trauma, rigidity, torticollis or crepitus. No pain with movement, spinous process tenderness or muscular tenderness. Normal range of motion.  ?Skin: ?   General: Skin is warm and dry.  ?Neurological:  ?   General: No focal deficit present.  ?   Mental Status: She is alert.  ?Psychiatric:     ?   Behavior: Behavior is cooperative.  ? ? ?ED Results / Procedures / Treatments   ?Labs ?(all labs ordered are listed, but only abnormal results are displayed) ?Labs Reviewed - No data to display ? ?EKG ?None ? ?Radiology ?No results found. ? ?Procedures ?Marland Kitchen.Incision and Drainage ? ?Date/Time: 03/06/2022 3:59 PM ?Performed by: Loni Beckwith, PA-C ?Authorized by: Loni Beckwith, PA-C  ? ?Consent:  ?  Consent obtained:  Verbal ?  Consent given by:  Patient ?  Risks discussed:  Bleeding, incomplete drainage, pain and damage to other organs ?  Alternatives discussed:  No treatment ?Universal protocol:  ?  Procedure explained and questions answered to patient or proxy's satisfaction: yes   ?  Relevant documents present and verified: yes   ?  Required blood products, implants, devices, and special equipment available: yes   ?  Immediately prior to procedure, a time out was called: yes   ?  Patient identity confirmed:  Verbally with patient and arm band ?Location:  ?  Type:  Abscess ?  Location:  Mouth ?  Mouth location:  Alveolar process ?Anesthesia:  ?  Anesthesia method:  Local infiltration ?  Local anesthetic:  Lidocaine 1% w/o epi ?Procedure type:  ?  Complexity:  Simple ?Procedure details:  ?  Incision types:  Single straight ?  Incision depth:  Submucosal ?  Wound management:  Probed and deloculated ?  Drainage:  Purulent ?  Drainage amount:  Moderate ?  Wound treatment:  Wound left open ?Post-procedure details:  ?  Procedure completion:  Tolerated well, no immediate complications  ? ? ?Medications Ordered in ED ?Medications  ?lidocaine (PF) (XYLOCAINE) 1 % injection 10 mL (has no administration in time range)  ?oxyCODONE-acetaminophen (PERCOCET/ROXICET) 5-325 MG per tablet 1 tablet (has no administration in time range)  ? ? ?ED Course/ Medical Decision Making/ A&P ?  ?                        ?Medical Decision Making ?Risk ?Prescription drug management. ? ? ?Alert 26 year old female in no acute distress, nontoxic-appearing.  Presents to the emergency department with a chief complaint of dental pain and facial swelling. ? ?Information obtained from patient and patient's mother at bedside.  Past medical records were reviewed including previous provider notes.   ? ?On examination patient has small area of fluctuance and tenderness adjacent  to #4 tooth.  Consistent with dental abscess.  Discussed incision and drainage versus watchful waiting with patient.  Patient elects for incision and drainage at this time.  Verbal consent obtained from patient.  Procedure as noted above. ? ?Will send patient home with prescription for Augmentin, Peridex, and oral gel.  Patient requested that her prescriptions be sent to [facility] outpatient pharmacy.  This pharmacy is closed today.  Patient was offered medications being sent to 24-hour pharmacy however declines at this time.  We will give patient first dose of Augmentin in the emergency department.  Patient given information to follow-up with dental provider in outpatient setting.  Importance of dental follow-up was stressed to the patient. ? ?Discussed  results, findings, treatment and follow up. Patient and patient's mother advised of return precautions. Patient and patient's mother verbalized understanding and agreed with plan. ? ? ? ? ? ? ? ? ? ? ?Final Clinical Impression(s) / ED Diagnoses ?Final diagnoses:  ?Dental abscess  ? ? ?Rx / DC Orders ?ED Discharge Orders   ? ?      Ordered  ?  amoxicillin-clavulanate (AUGMENTIN) 875-125 MG tablet  Every 12 hours       ? 03/06/22 1558  ?  chlorhexidine (PERIDEX) 0.12 % solution  2 times daily       ? 03/06/22 1558  ?  lidocaine (XYLOCAINE) 2 % solution  As needed       ? 03/06/22 1558  ? ?  ?  ? ?  ? ? ?  ?Loni Beckwith, PA-C ?03/06/22 1600 ? ?  ?Pattricia Boss, MD ?03/06/22 2028 ? ?

## 2022-03-06 NOTE — Discharge Instructions (Addendum)
You came to the emergency department today to be evaluated for your facial swelling.  You are found to have a dental abscess.  An incision and drainage was performed.  I have given you prescription for Augmentin, Peridex, and viscous lidocaine.  You may use the viscous lidocaine as often as needed for dental pain as long as you swish and spit the solution out.  If you swallow the solution please only use it every 4 hours.  It is very important that you follow-up with a dentist.  Please contact Dr. Link Snuffer or one of the providers listed on the resource guide for a follow-up appointment. ? ?You may have diarrhea from the antibiotics.  It is very important that you continue to take the antibiotics even if you get diarrhea unless a medical professional tells you that you may stop taking them.  If you stop too early the bacteria you are being treated for will become stronger and you may need different, more powerful antibiotics that have more side effects and worsening diarrhea.  Please stay well hydrated and consider probiotics as they may decrease the severity of your diarrhea.  Please be aware that if you take any hormonal contraception (birth control pills, nexplanon, the ring, etc) that your birth control will not work while you are taking antibiotics and you need to use back up protection as directed on the birth control medication information insert.  ? ?Please take Ibuprofen (Advil, motrin) and Tylenol (acetaminophen) to relieve your pain.   ? ?You may take up to 600 MG (3 pills) of normal strength ibuprofen every 8 hours as needed.   ?You make take tylenol, up to 1,000 mg (two extra strength pills) every 8 hours as needed.  ? ?It is safe to take ibuprofen and tylenol at the same time as they work differently.  ? Do not take more than 3,000 mg tylenol in a 24 hour period (not more than one dose every 8 hours.  Please check all medication labels as many medications such as pain and cold medications may contain  tylenol.  Do not drink alcohol while taking these medications.  Do not take other NSAID'S while taking ibuprofen (such as aleve or naproxen).  Please take ibuprofen with food to decrease stomach upset. ? ?Get help right away if: ?Your symptoms suddenly get worse. ?You have a very bad headache. ?You have problems breathing or swallowing. ?You have trouble opening your mouth. ?You have swelling in your neck or around your eye. ?

## 2022-03-06 NOTE — ED Triage Notes (Signed)
Pt here for L side facial swelling that she woke up with today. Pt states this has happened approx 20 times and they've had to drain it before. Pt denies fevers, chills and nausea.  ?

## 2022-03-07 ENCOUNTER — Telehealth (HOSPITAL_COMMUNITY): Payer: Self-pay | Admitting: Emergency Medicine

## 2022-03-07 ENCOUNTER — Other Ambulatory Visit: Payer: Self-pay

## 2022-03-07 MED ORDER — CHLORHEXIDINE GLUCONATE 0.12 % MT SOLN
15.0000 mL | Freq: Two times a day (BID) | OROMUCOSAL | 0 refills | Status: DC
  Filled 2022-03-07: qty 473, 16d supply, fill #0

## 2022-03-07 NOTE — Telephone Encounter (Signed)
I was contacted by pharmacy to change peridex amount to 451m.  Change was made.   ?

## 2022-03-09 ENCOUNTER — Other Ambulatory Visit: Payer: Self-pay

## 2022-05-31 ENCOUNTER — Encounter: Payer: Self-pay | Admitting: *Deleted

## 2022-06-16 ENCOUNTER — Other Ambulatory Visit: Payer: Self-pay

## 2022-06-16 MED ORDER — CHLORHEXIDINE GLUCONATE 0.12 % MT SOLN: OROMUCOSAL | 0 refills | Status: DC

## 2022-06-17 ENCOUNTER — Other Ambulatory Visit: Payer: Self-pay

## 2022-06-17 MED ORDER — CHLORHEXIDINE GLUCONATE 0.12 % MT SOLN
180.0000 mL | Freq: Two times a day (BID) | OROMUCOSAL | 0 refills | Status: DC
  Filled 2022-06-17: qty 473, 7d supply, fill #0

## 2022-08-11 ENCOUNTER — Emergency Department (HOSPITAL_COMMUNITY)
Admission: EM | Admit: 2022-08-11 | Discharge: 2022-08-11 | Disposition: A | Discharge status: Home / Self Care | Payer: Self-pay | Attending: Pediatric Emergency Medicine | Admitting: Pediatric Emergency Medicine

## 2022-08-11 ENCOUNTER — Emergency Department (HOSPITAL_COMMUNITY): Payer: Self-pay

## 2022-08-11 DIAGNOSIS — S92412A Displaced fracture of proximal phalanx of left great toe, initial encounter for closed fracture: Secondary | ICD-10-CM | POA: Insufficient documentation

## 2022-08-11 DIAGNOSIS — W109XXA Fall (on) (from) unspecified stairs and steps, initial encounter: Secondary | ICD-10-CM | POA: Insufficient documentation

## 2022-08-11 DIAGNOSIS — Y9301 Activity, walking, marching and hiking: Secondary | ICD-10-CM | POA: Insufficient documentation

## 2022-08-11 DIAGNOSIS — R269 Unspecified abnormalities of gait and mobility: Secondary | ICD-10-CM | POA: Insufficient documentation

## 2022-08-11 MED ORDER — IBUPROFEN 400 MG PO TABS
800.0000 mg | ORAL_TABLET | Freq: Once | ORAL | Status: AC
  Administered 2022-08-11: 800 mg via ORAL
  Filled 2022-08-11: qty 2

## 2022-08-11 NOTE — ED Provider Notes (Signed)
[facility] EMERGENCY DEPARTMENT Provider Note   CSN: [id number] Arrival date & time: 08/11/22  1148     History  Chief Complaint  Patient presents with   Toe Injury    Kathleen Alexander is a 26 y.o. female healthy who fell while walking on steps today and turn her left big toe inward during fall.  No other injuries.  No loss conscious.  No vomiting.  No medications prior.  HPI     Home Medications Prior to Admission medications   Medication Sig Start Date End Date Taking? Authorizing Provider  amoxicillin-clavulanate (AUGMENTIN) 875-125 MG tablet Take 1 tablet by mouth every 12 (twelve) hours. 03/06/22   Loni Beckwith, PA-C  chlorhexidine (PERIDEX) 0.12 % solution Use as directed 15 mLs in the mouth or throat 2 (two) times daily. 03/07/22   Loni Beckwith, PA-C  chlorhexidine (PERIDEX) 0.12 % solution Swish 15 mL by mouth 2 times a day for 7 days. 06/15/22     chlorhexidine (PERIDEX) 0.12 % solution swish 70m mLs in mouth mouth then spit out  2 (two) times  for 7 days 06/17/22     lidocaine (LMX) 4 % cream Apply 1 application topically as needed. 01/30/20   Joy, Shawn C, PA-C  lidocaine (XYLOCAINE) 2 % solution Use as directed 15 mLs in the mouth or throat as needed for mouth pain. Swish and spit 03/06/22   BLoni Beckwith PA-C  naproxen (NAPROSYN) 500 MG tablet Take 1 tablet (500 mg total) by mouth 2 (two) times daily between meals as needed for moderate pain (For discomfort and swelling.). 01/22/20   GCorena Herter PA-C  ondansetron (ZOFRAN-ODT) 8 MG disintegrating tablet Take 1 tablet (8 mg total) by mouth every 8 (eight) hours as needed for nausea or vomiting. 12/06/21   Lilland, Alana, DO      Allergies    Patient has no known allergies.    Review of Systems   Review of Systems  All other systems reviewed and are negative.   Physical Exam Updated Vital Signs BP (!) 126/54 (BP Location: Left Arm)   Pulse 75   Temp 99.2 F (37.3 C)  (Oral)   Resp 15   SpO2 99%  Physical Exam Vitals and nursing note reviewed.  Constitutional:      General: She is not in acute distress.    Appearance: She is well-developed.  HENT:     Head: Normocephalic and atraumatic.     Nose: No congestion or rhinorrhea.  Eyes:     Conjunctiva/sclera: Conjunctivae normal.  Cardiovascular:     Rate and Rhythm: Normal rate and regular rhythm.     Heart sounds: No murmur heard. Pulmonary:     Effort: Pulmonary effort is normal. No respiratory distress.     Breath sounds: Normal breath sounds.  Abdominal:     Palpations: Abdomen is soft.     Tenderness: There is no abdominal tenderness.  Musculoskeletal:        General: Swelling and tenderness present.     Cervical back: Neck supple.  Skin:    General: Skin is warm and dry.     Capillary Refill: Capillary refill takes less than 2 seconds.  Neurological:     General: No focal deficit present.     Mental Status: She is alert.     Gait: Gait abnormal.     ED Results / Procedures / Treatments   Labs (all labs ordered are listed, but only abnormal  results are displayed) Labs Reviewed - No data to display  EKG None  Radiology DG Foot Complete Left  Result Date: 08/11/2022 CLINICAL DATA:  Severe pain in first digit of left foot after a staircase accident. EXAM: LEFT FOOT - COMPLETE 3+ VIEW COMPARISON:  None Available. FINDINGS: Acute intra-articular comminuted fracture of the base of the great toe distal phalanx, with mild displacement of the bone fragments. There is also mild foreshortening of the distal phalanx. No additional acute fracture visualized. Remainder of the joint spaces are well-preserved. Moderate surrounding soft tissue swelling. No joint effusion. No radiopaque foreign bodies. IMPRESSION: Acute comminuted intra-articular fracture of the base of the great toe distal phalanx. Electronically Signed   By: Beryle Flock M.D.   On: 08/11/2022 13:14    Procedures Procedures     Medications Ordered in ED Medications  ibuprofen (ADVIL) tablet 800 mg (800 mg Oral Given 08/11/22 1620)    ED Course/ Medical Decision Making/ A&P                           Medical Decision Making Amount and/or Complexity of Data Reviewed External Data Reviewed: notes. Radiology: ordered and independent interpretation performed. Decision-making details documented in ED Course.  Risk OTC drugs. Prescription drug management.   26 year old female otherwise healthy here with toe injury.  No other injuries.  Well-appearing on exam.  Toe appears midline without laceration.  Doubt open fracture at this time.  Toe appears to lie appropriately and no other area of tenderness over the entirety of the foot.  X-ray visualized and shows displaced intra-articular fracture when I visualized.  With displacement I discussed with on-call orthopedic Dr. Kathaleen Bury who agreed with postop shoe and follow-up in 7 to 10 days.  Symptomatic management discussed.  Return precautions discussed.  Patient discharged.        Final Clinical Impression(s) / ED Diagnoses Final diagnoses:  Closed displaced fracture of proximal phalanx of left great toe, initial encounter    Rx / DC Orders ED Discharge Orders     None         Brent Bulla, MD 08/11/22 2158

## 2022-08-11 NOTE — ED Triage Notes (Signed)
Patient from home after tripping up a step and big toe on left foot bent the wrong way. Pt denies falling and hitting her head, just here for the toe.

## 2022-08-11 NOTE — ED Provider Triage Note (Signed)
Emergency Medicine Provider Triage Evaluation Note  POET HINEMAN , a 26 y.o. female  was evaluated in triage.  Pt complains of left great toe pain after hitting it on the step. No other injury.  Review of Systems  Positive:  Negative:   Physical Exam  BP 121/77 (BP Location: Right Arm)   Pulse 83   Temp 98.9 F (37.2 C) (Oral)   Resp 16   SpO2 100%  Gen:   Awake, no distress   Resp:  Normal effort  MSK:   Moves extremities without difficulty  Other:  Pulses intact. Compartments soft. No obvious deformity.   Medical Decision Making  Medically screening exam initiated at 12:48 PM.  Appropriate orders placed.  Thom Chimes was informed that the remainder of the evaluation will be completed by another provider, this initial triage assessment does not replace that evaluation, and the importance of remaining in the ED until their evaluation is complete.  XR ordered   Sherrell Puller, PA-C 08/11/22 1249

## 2022-08-11 NOTE — Progress Notes (Signed)
Orthopedic Tech Progress Note Patient Details:  Kathleen Alexander 04-03-1996 [id number]   Ortho Devices Type of Ortho Device: Postop shoe/boot Ortho Device/Splint Location: RLE Ortho Device/Splint Interventions: Ordered, Application, Adjustment   Post Interventions Patient Tolerated: Well, Ambulated well Instructions Provided: Care of device, Adjustment of device  [name] Jeri Modena 08/11/2022, 4:57 PM

## 2022-10-07 ENCOUNTER — Ambulatory Visit (INDEPENDENT_AMBULATORY_CARE_PROVIDER_SITE_OTHER): Payer: Self-pay | Admitting: Family Medicine

## 2022-10-07 ENCOUNTER — Other Ambulatory Visit: Payer: Self-pay

## 2022-10-07 VITALS — BP 101/52 | HR 85 | Ht 65.5 in | Wt 161.8 lb

## 2022-10-07 DIAGNOSIS — B353 Tinea pedis: Secondary | ICD-10-CM

## 2022-10-07 MED ORDER — CLOTRIMAZOLE 1 % EX SOLN
1.0000 | Freq: Two times a day (BID) | CUTANEOUS | 1 refills | Status: AC
  Filled 2022-10-07: qty 60, 30d supply, fill #0
  Filled 2022-11-02: qty 60, 30d supply, fill #1

## 2022-10-07 NOTE — Patient Instructions (Signed)
It was wonderful to see you today. Thank you for allowing me to be a part of your care. Below is a short summary of what we discussed at your visit today:  Tinea pedis or athlete's foot You have a slow-growing fungal infection between the toes.  This can be easily cured with the topical treatment of a cream.  I have sent in a prescription for clotrimazole (Lotrimin) cream 1% to your pharmacy.  Apply twice a day every single day.  The minimum treatment time is 4 weeks, however you can go up to 6.  If at 4 weeks you still believe there needs to be a little improvement, you can continue two more weeks for a total of 6 weeks.  Come back for further evaluation if the infection spreads to your toes or does not go away with this lotion after 6 weeks.     If you have any questions or concerns, please do not hesitate to contact us via phone or MyChart message.   Ezequiel Essex, MD

## 2022-10-07 NOTE — Progress Notes (Signed)
    SUBJECTIVE:   CHIEF COMPLAINT / HPI:   Foot complaint Ms. Bahe is a pleasant 26 year old woman who presents today with bilateral pruritic foot rash.  She reports has been going on "for a while", estimates more than 6 months.  The webspaces between her toes feel quite dry, itchy, and have a burning sensation.  She has tried moisturizing with cocoa butter, baby oil, and high-quality lotions.  In 2019 she was diagnosed with tinea pedis and told to use clotrimazole cream.  She does not believe this worked very well, but after discussion she does not believe that she did it twice a day for 4 to 6 weeks.  Last RPR test 01/30/2020, was negative.  PERTINENT  PMH / PSH: Hematuria, gonorrhea, chlamydia  OBJECTIVE:   BP (!) 101/52   Pulse 85   Ht 5' 5.5" (1.664 m)   Wt 161 lb 12.8 oz (73.4 kg)   LMP 09/12/2022 (Exact Date)   SpO2 100%   BMI 26.52 kg/m    PHQ-9:     10/07/2022    2:47 PM 12/06/2021    3:06 PM 06/09/2021    2:39 PM  Depression screen PHQ 2/9  Decreased Interest 0 2 0  Down, Depressed, Hopeless 0 0 0  PHQ - 2 Score 0 2 0  Altered sleeping 0 0 0  Tired, decreased energy 0 3 0  Change in appetite 0 3 0  Feeling bad or failure about yourself  0 0 0  Trouble concentrating 0 0 0  Moving slowly or fidgety/restless 0 2 0  Suicidal thoughts 0 0 0  PHQ-9 Score 0 10 0  Difficult doing work/chores Not difficult at all  Not difficult at all    Physical Exam General: Awake, alert, oriented, no acute distress Respiratory: Unlabored respirations, speaking in full sentences, no respiratory distress Extremities: Dry, flaky, hyperpigmented skin present in each interdigital space of bilateral feet, nails appear normal and without onychomycosis  ASSESSMENT/PLAN:   Tinea pedis of both feet Rx clotrimazole 1% cream twice daily x4 to 6 weeks.  Return precautions given.  See AVS for more.     Ezequiel Essex, MD [place]

## 2022-10-07 NOTE — Assessment & Plan Note (Signed)
Rx clotrimazole 1% cream twice daily x4 to 6 weeks.  Return precautions given.  See AVS for more.

## 2022-10-10 ENCOUNTER — Other Ambulatory Visit: Payer: Self-pay

## 2022-10-11 ENCOUNTER — Other Ambulatory Visit: Payer: Self-pay

## 2022-10-27 ENCOUNTER — Other Ambulatory Visit: Payer: Self-pay

## 2022-11-01 ENCOUNTER — Other Ambulatory Visit: Payer: Self-pay

## 2022-11-01 ENCOUNTER — Other Ambulatory Visit: Payer: Self-pay | Admitting: Family Medicine

## 2022-11-02 ENCOUNTER — Other Ambulatory Visit: Payer: Self-pay

## 2022-11-03 ENCOUNTER — Other Ambulatory Visit: Payer: Self-pay

## 2022-11-07 ENCOUNTER — Other Ambulatory Visit: Payer: Self-pay

## 2022-12-14 ENCOUNTER — Other Ambulatory Visit (HOSPITAL_COMMUNITY): Payer: Self-pay

## 2023-01-13 ENCOUNTER — Encounter (HOSPITAL_COMMUNITY): Payer: Self-pay | Admitting: Emergency Medicine

## 2023-01-13 ENCOUNTER — Other Ambulatory Visit: Payer: Self-pay

## 2023-01-13 ENCOUNTER — Emergency Department (HOSPITAL_COMMUNITY)
Admission: EM | Admit: 2023-01-13 | Discharge: 2023-01-13 | Disposition: A | Discharge status: Home / Self Care | Payer: Self-pay | Attending: Student | Admitting: Student

## 2023-01-13 DIAGNOSIS — R509 Fever, unspecified: Secondary | ICD-10-CM | POA: Insufficient documentation

## 2023-01-13 DIAGNOSIS — J029 Acute pharyngitis, unspecified: Secondary | ICD-10-CM | POA: Insufficient documentation

## 2023-01-13 DIAGNOSIS — Z1152 Encounter for screening for COVID-19: Secondary | ICD-10-CM | POA: Insufficient documentation

## 2023-01-13 LAB — RESP PANEL BY RT-PCR (RSV, FLU A&B, COVID)  RVPGX2
Influenza A by PCR: NEGATIVE
Influenza B by PCR: NEGATIVE
Resp Syncytial Virus by PCR: NEGATIVE
SARS Coronavirus 2 by RT PCR: NEGATIVE

## 2023-01-13 LAB — GROUP A STREP BY PCR: Group A Strep by PCR: NOT DETECTED

## 2023-01-13 MED ORDER — NAPROXEN 375 MG PO TABS
375.0000 mg | ORAL_TABLET | Freq: Two times a day (BID) | ORAL | 0 refills | Status: DC
  Filled 2023-01-13: qty 20, 10d supply, fill #0

## 2023-01-13 MED ORDER — ONDANSETRON 4 MG PO TBDP
4.0000 mg | ORAL_TABLET | Freq: Once | ORAL | Status: AC
  Administered 2023-01-13: 4 mg via ORAL
  Filled 2023-01-13: qty 1

## 2023-01-13 MED ORDER — IBUPROFEN 800 MG PO TABS
800.0000 mg | ORAL_TABLET | Freq: Once | ORAL | Status: AC
  Administered 2023-01-13: 800 mg via ORAL
  Filled 2023-01-13: qty 1

## 2023-01-13 MED ORDER — ACETAMINOPHEN 500 MG PO TABS
1000.0000 mg | ORAL_TABLET | Freq: Once | ORAL | Status: AC
  Administered 2023-01-13: 1000 mg via ORAL
  Filled 2023-01-13: qty 2

## 2023-01-13 MED ORDER — ONDANSETRON 4 MG PO TBDP
4.0000 mg | ORAL_TABLET | Freq: Three times a day (TID) | ORAL | 0 refills | Status: DC | PRN
  Filled 2023-01-13: qty 20, 7d supply, fill #0

## 2023-01-13 NOTE — ED Notes (Signed)
Patient is alert and oriented.  No s/sx of resp distress.  She reports her pain has decreased with pain medications.
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
## 2023-01-13 NOTE — ED Triage Notes (Signed)
Pt here from home with fever and flu like like symptoms started 2 days ago

## 2023-01-13 NOTE — ED Notes (Signed)
Patient verbalized understanding of discharge instructions and reasons to return to the ED 

## 2023-01-15 ENCOUNTER — Emergency Department (HOSPITAL_COMMUNITY)
Admission: EM | Admit: 2023-01-15 | Discharge: 2023-01-15 | Disposition: A | Discharge status: Home / Self Care | Payer: Self-pay | Attending: Emergency Medicine | Admitting: Emergency Medicine

## 2023-01-15 ENCOUNTER — Emergency Department (HOSPITAL_COMMUNITY): Payer: Self-pay

## 2023-01-15 ENCOUNTER — Encounter (HOSPITAL_COMMUNITY): Payer: Self-pay

## 2023-01-15 ENCOUNTER — Other Ambulatory Visit (HOSPITAL_COMMUNITY): Payer: Self-pay

## 2023-01-15 DIAGNOSIS — K529 Noninfective gastroenteritis and colitis, unspecified: Secondary | ICD-10-CM | POA: Insufficient documentation

## 2023-01-15 DIAGNOSIS — Z1152 Encounter for screening for COVID-19: Secondary | ICD-10-CM | POA: Insufficient documentation

## 2023-01-15 DIAGNOSIS — E860 Dehydration: Secondary | ICD-10-CM | POA: Insufficient documentation

## 2023-01-15 LAB — CBC
HCT: 35.6 % — ABNORMAL LOW (ref 36.0–46.0)
Hemoglobin: 12.1 g/dL (ref 12.0–15.0)
MCH: 32.2 pg (ref 26.0–34.0)
MCHC: 34 g/dL (ref 30.0–36.0)
MCV: 94.7 fL (ref 80.0–100.0)
Platelets: 212 10*3/uL (ref 150–400)
RBC: 3.76 MIL/uL — ABNORMAL LOW (ref 3.87–5.11)
RDW: 12.1 % (ref 11.5–15.5)
WBC: 12.7 10*3/uL — ABNORMAL HIGH (ref 4.0–10.5)
nRBC: 0 % (ref 0.0–0.2)

## 2023-01-15 LAB — URINALYSIS, ROUTINE W REFLEX MICROSCOPIC
Glucose, UA: NEGATIVE mg/dL
Hgb urine dipstick: NEGATIVE
Ketones, ur: 80 mg/dL — AB
Nitrite: NEGATIVE
Protein, ur: 100 mg/dL — AB
Specific Gravity, Urine: 1.031 — ABNORMAL HIGH (ref 1.005–1.030)
pH: 5 (ref 5.0–8.0)

## 2023-01-15 LAB — I-STAT BETA HCG BLOOD, ED (MC, WL, AP ONLY): I-stat hCG, quantitative: 6 m[IU]/mL — ABNORMAL HIGH (ref <5)

## 2023-01-15 LAB — COMPREHENSIVE METABOLIC PANEL
ALT: 18 U/L (ref 0–44)
AST: 24 U/L (ref 15–41)
Albumin: 4 g/dL (ref 3.5–5.0)
Alkaline Phosphatase: 63 U/L (ref 38–126)
Anion gap: 10 (ref 5–15)
BUN: 14 mg/dL (ref 6–20)
CO2: 23 mmol/L (ref 22–32)
Calcium: 8.9 mg/dL (ref 8.9–10.3)
Chloride: 96 mmol/L — ABNORMAL LOW (ref 98–111)
Creatinine, Ser: 0.92 mg/dL (ref 0.44–1.00)
GFR, Estimated: >60 mL/min (ref >60)
Glucose, Bld: 110 mg/dL — ABNORMAL HIGH (ref 70–99)
Potassium: 3.6 mmol/L (ref 3.5–5.1)
Sodium: 129 mmol/L — ABNORMAL LOW (ref 135–145)
Total Bilirubin: 1.3 mg/dL — ABNORMAL HIGH (ref 0.3–1.2)
Total Protein: 8.6 g/dL — ABNORMAL HIGH (ref 6.5–8.1)

## 2023-01-15 LAB — RESP PANEL BY RT-PCR (RSV, FLU A&B, COVID)  RVPGX2
Influenza A by PCR: NEGATIVE
Influenza B by PCR: NEGATIVE
Resp Syncytial Virus by PCR: NEGATIVE
SARS Coronavirus 2 by RT PCR: NEGATIVE

## 2023-01-15 LAB — PREGNANCY, URINE: Preg Test, Ur: NEGATIVE

## 2023-01-15 LAB — LIPASE, BLOOD: Lipase: 34 U/L (ref 11–51)

## 2023-01-15 LAB — HCG, QUANTITATIVE, PREGNANCY: hCG, Beta Chain, Quant, S: <1 m[IU]/mL (ref <5)

## 2023-01-15 MED ORDER — ACETAMINOPHEN 325 MG PO TABS
650.0000 mg | ORAL_TABLET | Freq: Once | ORAL | Status: AC
  Administered 2023-01-15: 650 mg via ORAL
  Filled 2023-01-15: qty 2

## 2023-01-15 MED ORDER — LACTATED RINGERS IV BOLUS
1000.0000 mL | Freq: Once | INTRAVENOUS | Status: AC
  Administered 2023-01-15: 1000 mL via INTRAVENOUS

## 2023-01-15 MED ORDER — ONDANSETRON 4 MG PO TBDP
4.0000 mg | ORAL_TABLET | ORAL | 0 refills | Status: DC | PRN
  Filled 2023-01-16: qty 10, 2d supply, fill #0

## 2023-01-15 MED ORDER — IOHEXOL 300 MG/ML  SOLN
100.0000 mL | Freq: Once | INTRAMUSCULAR | Status: AC | PRN
  Administered 2023-01-15: 100 mL via INTRAVENOUS

## 2023-01-15 MED ORDER — ONDANSETRON HCL 4 MG/2ML IJ SOLN
4.0000 mg | Freq: Once | INTRAMUSCULAR | Status: AC
  Administered 2023-01-15: 4 mg via INTRAVENOUS
  Filled 2023-01-15: qty 2

## 2023-01-15 NOTE — ED Notes (Signed)
Pt. In CT. 

## 2023-01-15 NOTE — ED Provider Notes (Signed)
Patient's initial i-STAT hCG is 6.  However her urine pregnancy test is negative and hCG is negative.  I do not think she is pregnant.  She still needs CT abdomen pelvis and she is agreeable to that.   Drenda Freeze, MD 01/15/23 [phone]

## 2023-01-15 NOTE — ED Provider Notes (Signed)
I provided a substantive portion of the care of this patient.  I personally performed the entirety of the medical decision making for this encounter.     27 year old female presents with recurrent abdominal pain.  Pain is epigastric in nature.  Was seen here recently for same.  Had negative viral panel 24 hours ago.  Patient has temperature 103 at this time.  On exam she is tender epigastric area.  No lower abdominal comfort.  Patient's beta-hCG via i-STAT was 6.  Will send lab quantitative beta.  Patient will likely need abdominal CT.  Care turned over to Dr. Rudean Curt, Elberta Fortis, MD 01/15/23 [phone]

## 2023-01-15 NOTE — Discharge Instructions (Signed)
As we discussed, you likely have a stomach virus.  Your CT scan your labs were unremarkable except mild dehydration from a stomach virus.  In particular I do not see any source of bacterial infection.  Please stay hydrated and take Zofran for nausea.  You are expected to run a fever for several days and please take Tylenol or Motrin for fever  See your doctor for follow-up  Return to ER if you have worse abdominal pain or vomiting or dehydration.

## 2023-01-15 NOTE — ED Triage Notes (Signed)
Pt arrives via [facility] from home for abd pain, N/V, malaise. Pt seen yesterday for similar symptoms and had neg covid/flu/rsv and Strep A tests.

## 2023-01-15 NOTE — ED Provider Notes (Signed)
  Physical Exam  BP 129/76   Pulse 94   Temp 99.2 F (37.3 C) (Oral)   Resp 18   SpO2 98%   Physical Exam  Procedures  Procedures  ED Course / MDM    Medical Decision Making Care assumed at 3 PM.  Patient is here with fever abdominal pain.  Initial i-STAT hCG was 6.  Signed out pending serum quant and also urine pregnancy and CT abdomen pelvis if she is not pregnant and reassessment.   8:12 PM Patient's serum quant is negative and urine pregnancy is negative.  So the i-STAT is likely a false positive.  CT abdomen pelvis unremarkable.  Patient given IV fluids and Zofran and felt better now.  I think likely viral gastroenteritis.  Stable for discharge.  Problems Addressed: Dehydration: acute illness or injury Gastroenteritis: acute illness or injury  Amount and/or Complexity of Data Reviewed Labs: ordered. Decision-making details documented in ED Course. Radiology: ordered and independent interpretation performed. Decision-making details documented in ED Course.  Risk OTC drugs. Prescription drug management.          Drenda Freeze, MD 01/15/23 2013

## 2023-01-15 NOTE — ED Provider Notes (Signed)
[facility] Provider Note   CSN: [id number] Arrival date & time: 01/15/23  1128     History  Chief Complaint  Patient presents with   Abdominal Pain   Emesis    Kathleen Alexander is a 27 y.o. female with no past medical history presenting to the ED complaining of diffuse upper abdominal pain, nausea, and vomiting for the last 4 days.  Patient states that she has been unable to eat or drink in the last 4 days due to the severity of her nausea and vomiting.  She was evaluated in the ED 2 days ago for upper respiratory symptoms but is not complaining of the symptoms today.  She denies fever at home, diarrhea, chest pain, shortness of breath, hematemesis, hematochezia, melena, or other complaints. She is febrile on arrival however at 34 F. States that she is unaware if she is pregnant.  She has had unprotected sexual intercourse within the last month and as recent as 2 days ago.  She denies vaginal bleeding, vaginal discharge, or lower abdominal or pelvic pain.  Last menstrual period started on 12/14/2022.  She normally has regular menstrual cycles. Last time she took Tylenol was last night.      Home Medications Prior to Admission medications   Medication Sig Start Date End Date Taking? Authorizing Provider  acetaminophen (TYLENOL) 500 MG tablet Take 1,000 mg by mouth every 6 (six) hours as needed for mild pain.   Yes [provider]  naproxen (NAPROSYN) 375 MG tablet Take 1 tablet (375 mg total) by mouth 2 (two) times daily. Patient not taking: Reported on 01/15/2023 01/13/23   Kommor, Debe Coder, MD  ondansetron (ZOFRAN-ODT) 4 MG disintegrating tablet Take 1 tablet (4 mg total) by mouth every 8 (eight) hours as needed for nausea or vomiting. Patient not taking: Reported on 01/15/2023 01/13/23   Teressa Lower, MD      Allergies    Patient has no known allergies.    Review of Systems   Review of Systems  Constitutional:  Positive for  appetite change and fever. Negative for activity change and chills.  HENT:  Negative for congestion, ear pain and sore throat.   Eyes:  Negative for pain and visual disturbance.  Respiratory:  Negative for cough, chest tightness and shortness of breath.   Cardiovascular:  Negative for chest pain, palpitations and leg swelling.  Gastrointestinal:  Positive for abdominal pain, nausea and vomiting. Negative for anal bleeding, blood in stool, constipation and diarrhea.  Genitourinary:  Negative for decreased urine volume, difficulty urinating, dysuria, flank pain, frequency, hematuria, urgency, vaginal bleeding and vaginal discharge.  Musculoskeletal:  Negative for arthralgias and back pain.  Skin:  Negative for color change and rash.  Neurological:  Negative for dizziness, seizures, syncope and light-headedness.  All other systems reviewed and are negative.   Physical Exam Updated Vital Signs BP 100/61   Pulse 89   Temp (!) 103 F (39.4 C) (Oral)   Resp 16   SpO2 100%  Physical Exam Vitals and nursing note reviewed.  Constitutional:      General: She is not in acute distress.    Appearance: Normal appearance. She is not toxic-appearing.  HENT:     Head: Normocephalic and atraumatic.     Mouth/Throat:     Mouth: Mucous membranes are dry.     Pharynx: Oropharynx is clear. Uvula midline. No pharyngeal swelling, oropharyngeal exudate, posterior oropharyngeal erythema or uvula swelling.  Eyes:  General: No scleral icterus.    Conjunctiva/sclera: Conjunctivae normal.  Cardiovascular:     Rate and Rhythm: Normal rate and regular rhythm.     Heart sounds: No murmur heard. Pulmonary:     Effort: Pulmonary effort is normal.     Breath sounds: Normal breath sounds.  Abdominal:     General: Abdomen is flat. There is no distension.     Palpations: Abdomen is soft.     Tenderness: There is abdominal tenderness (diffuse upper). There is no right CVA tenderness, left CVA tenderness,  guarding or rebound. Negative signs include Murphy's sign, Rovsing's sign and McBurney's sign.  Musculoskeletal:        General: Normal range of motion.     Cervical back: Neck supple.     Right lower leg: No edema.     Left lower leg: No edema.  Skin:    General: Skin is warm and dry.     Capillary Refill: Capillary refill takes 2 to 3 seconds.  Neurological:     Mental Status: She is alert. Mental status is at baseline.  Psychiatric:        Mood and Affect: Mood normal.        Behavior: Behavior normal.     ED Results / Procedures / Treatments   Labs (all labs ordered are listed, but only abnormal results are displayed) Labs Reviewed  COMPREHENSIVE METABOLIC PANEL - Abnormal; Notable for the following components:      Result Value   Sodium 129 (*)    Chloride 96 (*)    Glucose, Bld 110 (*)    Total Protein 8.6 (*)    Total Bilirubin 1.3 (*)    All other components within normal limits  CBC - Abnormal; Notable for the following components:   WBC 12.7 (*)    RBC 3.76 (*)    HCT 35.6 (*)    All other components within normal limits  URINALYSIS, ROUTINE W REFLEX MICROSCOPIC - Abnormal; Notable for the following components:   Color, Urine AMBER (*)    APPearance HAZY (*)    Specific Gravity, Urine 1.031 (*)    Bilirubin Urine SMALL (*)    Ketones, ur 80 (*)    Protein, ur 100 (*)    Leukocytes,Ua SMALL (*)    Bacteria, UA RARE (*)    All other components within normal limits  I-STAT BETA HCG BLOOD, ED ([facility], [facility], [facility] ONLY) - Abnormal; Notable for the following components:   I-stat hCG, quantitative 6.0 (*)    All other components within normal limits  RESP PANEL BY RT-PCR (RSV, FLU A&B, COVID)  RVPGX2  LIPASE, BLOOD  PREGNANCY, URINE  HCG, QUANTITATIVE, PREGNANCY    EKG None  Radiology No results found.  Procedures Procedures   Medications Ordered in ED Medications  acetaminophen (TYLENOL) tablet 650 mg (650 mg Oral Given 01/15/23 1249)  lactated ringers bolus  1,000 mL (1,000 mLs Intravenous New Bag/Given 01/15/23 1314)  ondansetron (ZOFRAN) injection 4 mg (4 mg Intravenous Given 01/15/23 1310)    ED Course/ Medical Decision Making/ A&P                             Medical Decision Making Amount and/or Complexity of Data Reviewed Labs: ordered. Decision-making details documented in ED Course. Radiology: ordered. Decision-making details documented in ED Course.  Risk OTC drugs. Prescription drug management.   27 year old female presenting to ED with c/o  upper abdominal pain, nausea, and vomiting. Febrile on arrival, remainder of vital signs unremarkable. The causes of abdominal pain include but not limited to cholecystitis, appendicitis, diverticulitis, gastritis, gastroenteritis, nephrolithiasis, pancreatitis, peritonitis, constipation, UTI, SBO/LBO, biliary disease, normal pregnancy, ectopic pregnancy, ovarian torsion, PID. Pt unsure of pregnancy status. iStat beta hcg elevated by one point at 6. Quantitative beta hcg ordered for further lab evaluation. Pt rechecked and with continued upper abdominal pain, vomiting controlled with medications given in ED. Discussed possibility of pregnancy, pt confirmed again she is not having lower abdominal pain, pelvic pain, vaginal bleeding or vaginal discharge. She requested further evaluation of gallbladder. RUQ ultrasound ordered. Low suspicion for this with normal LFTs. Discussed case with attending MD and in setting of fever of unknown etiology and continued abdominal pain, he is in agreement a CT abd/pelvis would be best option to rule out surgical emergency. He went to discuss with pt and we will wait on quantitative beta hcg from lab to result and rediscuss CT scan with pt and allow her to make a decision at that time. She does not want to proceed with scan at this time until beta hcg results and we can better evaluate pregnancy status. Pending this result and rediscussion with pt at shift change regarding  proceeding with CT scan at shift change. Care signed out to Dr. Darl Householder pending this and disposition.        Final Clinical Impression(s) / ED Diagnoses Final diagnoses:  Dehydration    Rx / DC Orders ED Discharge Orders     None         Turner Daniels 01/15/23 1535    Lacretia Leigh, MD 01/16/23 1326

## 2023-01-16 ENCOUNTER — Other Ambulatory Visit: Payer: Self-pay

## 2023-01-18 ENCOUNTER — Encounter: Payer: Self-pay | Admitting: Family Medicine

## 2023-01-18 ENCOUNTER — Ambulatory Visit: Payer: Self-pay

## 2023-01-18 DIAGNOSIS — K760 Fatty (change of) liver, not elsewhere classified: Secondary | ICD-10-CM | POA: Insufficient documentation

## 2023-01-18 NOTE — Progress Notes (Deleted)
    SUBJECTIVE:   CHIEF COMPLAINT / HPI:   Dizziness, ED follow up - seen in ED on 01/15/23 for 4 days abdominal pain, nausea, and vomiting. Was febrile in ED. Given IVF and Zofran.   Na 129, T bili 1.3, WBC 12.7, serum Bhcg quant negative, urinalysis concentrated with 11-20 WBCs, 11-20 squamous cells, rare bacteria, hyaline casts and mucus. Flu/COVID/RSV negative, strep negative. Normal RUQ sono and CT abd pelvis showing hepatic steatosis but no acute changes.   PERTINENT  PMH / PSH: ***  OBJECTIVE:   There were no vitals taken for this visit.  ***  ASSESSMENT/PLAN:   No problem-specific Assessment & Plan notes found for this encounter.     Lenoria Chime, MD [facility]

## 2023-03-31 ENCOUNTER — Ambulatory Visit (INDEPENDENT_AMBULATORY_CARE_PROVIDER_SITE_OTHER): Payer: Self-pay | Admitting: Family Medicine

## 2023-03-31 ENCOUNTER — Other Ambulatory Visit: Payer: Self-pay

## 2023-03-31 ENCOUNTER — Encounter: Payer: Self-pay | Admitting: Family Medicine

## 2023-03-31 VITALS — BP 108/73 | HR 86 | Ht 65.0 in | Wt 168.0 lb

## 2023-03-31 DIAGNOSIS — B353 Tinea pedis: Secondary | ICD-10-CM

## 2023-03-31 MED ORDER — CLOTRIMAZOLE 1 % EX CREA
1.0000 | TOPICAL_CREAM | Freq: Two times a day (BID) | CUTANEOUS | 1 refills | Status: DC
  Filled 2023-03-31: qty 90, 45d supply, fill #0

## 2023-03-31 NOTE — Patient Instructions (Signed)
It was wonderful to see you today. Thank you for allowing me to be a part of your care. Below is a short summary of what we discussed at your visit today:  Athlete's foot Treat with the clotrimazole 1% cream again.  Apply twice daily for 6 to 8 weeks for full treatment.  Make sure to thoroughly disinfect your shoes or get new ones.   If it starts to involve the toenail, please let us know because that needs an oral medication instead of the cream.   Please bring all of your medications to every appointment!  If you have any questions or concerns, please do not hesitate to contact us via phone or MyChart message.   Fayette Pho, MD

## 2023-03-31 NOTE — Progress Notes (Unsigned)
    SUBJECTIVE:   CHIEF COMPLAINT / HPI:   Foot complaint Ms. Patraw is a 27 year old woman who presents with concern for athlete's foot. She was seen at [facility] last fall and successfully treated with clotrimazole cream. She reports the infection cleared up as expected.   However, for the last 2-3 weeks, her feet have begun itching and peeling in the spaces between toes. No involvement of toe nails.   PERTINENT  PMH / PSH:  Patient Active Problem List   Diagnosis Date Noted   Fatty liver on CT 01/18/2023   Dental decay 06/09/2021   Tooth abscess 01/21/2020   Hematuria 12/16/2019   Tinea pedis of both feet 08/02/2018   History of gonorrhea 03/22/2017   History of chlamydia 03/22/2017   Healthcare maintenance 03/15/2017    OBJECTIVE:   BP 108/73   Pulse 86   Ht 5\' 5"  (1.651 m)   Wt 168 lb (76.2 kg)   LMP 03/10/2023   SpO2 100%   BMI 27.96 kg/m    Gen: awake, alert, NAD Resp: speaking clearly in full sentences Ext: feet with peeling of the toe web spaces, no erythema or tenderness, no swelling or fluctuance, toe nails appear normal  ASSESSMENT/PLAN:   Tinea pedis of both feet Rx clotrimazole 1% cream BID x 4-6 wks. Discussed disposing of or heavily disinfecting current shoes. Return precautions given. Patient to contact us for PO treatment if begins to involve toenail.       Fayette Pho, MD [facility]

## 2023-04-01 NOTE — Assessment & Plan Note (Signed)
Rx clotrimazole 1% cream BID x 4-6 wks. Discussed disposing of or heavily disinfecting current shoes. Return precautions given. Patient to contact us for PO treatment if begins to involve toenail.

## 2023-04-03 ENCOUNTER — Encounter: Payer: Self-pay | Admitting: Student

## 2023-04-10 ENCOUNTER — Emergency Department (HOSPITAL_COMMUNITY)
Admission: EM | Admit: 2023-04-10 | Discharge: 2023-04-10 | Disposition: A | Discharge status: Home / Self Care | Payer: Medicaid Other | Attending: Emergency Medicine | Admitting: Emergency Medicine

## 2023-04-10 ENCOUNTER — Encounter (HOSPITAL_COMMUNITY): Payer: Self-pay

## 2023-04-10 ENCOUNTER — Other Ambulatory Visit: Payer: Self-pay

## 2023-04-10 ENCOUNTER — Ambulatory Visit: Payer: Self-pay | Admitting: Family Medicine

## 2023-04-10 DIAGNOSIS — K0889 Other specified disorders of teeth and supporting structures: Secondary | ICD-10-CM | POA: Insufficient documentation

## 2023-04-10 MED ORDER — AMOXICILLIN-POT CLAVULANATE 875-125 MG PO TABS
1.0000 | ORAL_TABLET | Freq: Once | ORAL | Status: AC
  Administered 2023-04-10: 1 via ORAL
  Filled 2023-04-10: qty 1

## 2023-04-10 MED ORDER — AMOXICILLIN-POT CLAVULANATE 875-125 MG PO TABS
1.0000 | ORAL_TABLET | Freq: Two times a day (BID) | ORAL | 0 refills | Status: DC
  Filled 2023-04-10: qty 14, 7d supply, fill #0

## 2023-04-10 NOTE — ED Provider Notes (Signed)
[place] EMERGENCY DEPARTMENT AT [facility] Provider Note   CSN: [id number] Arrival date & time: 04/10/23  7793     History  Chief Complaint  Patient presents with   Facial Pain    Kathleen Alexander is a 27 y.o. female.  27 year old female with prior medical history as detailed below presents for evaluation.  Patient with known history of dental decay and dental abscess.  Patient complains of pain to the left upper jaw with mild swelling related to same.  She took 1 dose of amoxicillin that she had leftover yesterday.  This improved the pain and swelling.  She is concerned that she may have a recurrent dental infection.  She is requesting a course of antibiotics for same.  She denies fever.  She denies difficulty speaking or swallowing.  She is otherwise without complaint.    The history is provided by the patient and medical records.       Home Medications Prior to Admission medications   Medication Sig Start Date End Date Taking? Authorizing Provider  amoxicillin-clavulanate (AUGMENTIN) 875-125 MG tablet Take 1 tablet by mouth every 12 (twelve) hours. 04/10/23  Yes Wynetta Fines, MD  acetaminophen (TYLENOL) 500 MG tablet Take 1,000 mg by mouth every 6 (six) hours as needed for mild pain.    [provider]  clotrimazole (LOTRIMIN) 1 % cream Apply 1 Application topically 2 (two) times daily. For 6-8 weeks for full treatment of athlete's foot. 03/31/23   Fayette Pho, MD  naproxen (NAPROSYN) 375 MG tablet Take 1 tablet (375 mg total) by mouth 2 (two) times daily. Patient not taking: Reported on 01/15/2023 01/13/23   Kommor, Wyn Forster, MD  ondansetron (ZOFRAN-ODT) 4 MG disintegrating tablet Take 1 tablet (4 mg total) by mouth every 4 (four) hours as needed for nausea/vomiting. 01/15/23   Charlynne Pander, MD      Allergies    Patient has no known allergies.    Review of Systems   Review of Systems  All other systems reviewed and are  negative.   Physical Exam Updated Vital Signs BP 115/73   Pulse 82   Resp 16   Ht 5\' 5"  (1.651 m)   Wt 76.2 kg   LMP 03/10/2023   SpO2 100%   BMI 27.96 kg/m  Physical Exam Vitals and nursing note reviewed.  Constitutional:      General: She is not in acute distress.    Appearance: Normal appearance. She is well-developed.  HENT:     Head: Normocephalic and atraumatic.     Mouth/Throat:     Comments: Fuhs dental decay noted.  Patient with localization of mild pain and mild edema to the left upper mandible and dentition.  Patient without evidence of discrete dental abscess on exam. Eyes:     Conjunctiva/sclera: Conjunctivae normal.     Pupils: Pupils are equal, round, and reactive to light.  Cardiovascular:     Rate and Rhythm: Normal rate and regular rhythm.     Heart sounds: Normal heart sounds.  Pulmonary:     Effort: Pulmonary effort is normal. No respiratory distress.     Breath sounds: Normal breath sounds.  Abdominal:     General: There is no distension.     Palpations: Abdomen is soft.     Tenderness: There is no abdominal tenderness.  Musculoskeletal:        General: No deformity. Normal range of motion.     Cervical back: Normal range of motion and  neck supple.  Skin:    General: Skin is warm and dry.  Neurological:     General: No focal deficit present.     Mental Status: She is alert and oriented to person, place, and time.     ED Results / Procedures / Treatments   Labs (all labs ordered are listed, but only abnormal results are displayed) Labs Reviewed - No data to display  EKG None  Radiology No results found.  Procedures Procedures    Medications Ordered in ED Medications  amoxicillin-clavulanate (AUGMENTIN) 875-125 MG per tablet 1 tablet (has no administration in time range)    ED Course/ Medical Decision Making/ A&P                             Medical Decision Making Risk Prescription drug management.    Medical Screen  Complete  This patient presented to the ED with complaint of dental pain, swelling.  This complaint involves an extensive number of treatment options. The initial differential diagnosis includes, but is not limited to, dental infection, dental abscess, etc.  This presentation is: Acute, Chronic, Self-Limited, Previously Undiagnosed, Uncertain Prognosis, and Complicated  Patient with known history of dental decay and poor dentition presents with onset of pain in left upper dentition.  History exam are concerning for early dental infection.  Patient given and prescribed antibiotics for same.  Patient does understand need for close outpatient follow-up with dentistry.  Strict return precautions given and understood.   Additional history obtained: External records from outside sources obtained and reviewed including prior ED visits and prior Inpatient records.    Medicines ordered:  I ordered medication including Augmentin for dental infection Reevaluation of the patient after these medicines showed that the patient: improved   Problem List / ED Course:  Dental pain   Reevaluation:  After the interventions noted above, I reevaluated the patient and found that they have: improved  Disposition:  After consideration of the diagnostic results and the patients response to treatment, I feel that the patent would benefit from close outpatient follow-up.          Final Clinical Impression(s) / ED Diagnoses Final diagnoses:  Pain, dental    Rx / DC Orders ED Discharge Orders          Ordered    amoxicillin-clavulanate (AUGMENTIN) 875-125 MG tablet  Every 12 hours        04/10/23 1005              Wynetta Fines, MD 04/10/23 1010

## 2023-04-10 NOTE — Progress Notes (Deleted)
    SUBJECTIVE:   CHIEF COMPLAINT / HPI:   Face swelling, antibiotic request?  DUE for physical  PERTINENT  PMH / PSH: ***  OBJECTIVE:   LMP 03/10/2023  ***  General: NAD, pleasant, able to participate in exam Cardiac: RRR, no murmurs. Respiratory: CTAB, normal effort, No wheezes, rales or rhonchi Abdomen: Bowel sounds present, nontender, nondistended Extremities: no edema or cyanosis. Skin: warm and dry, no rashes noted Neuro: alert, no obvious focal deficits Psych: Normal affect and mood  ASSESSMENT/PLAN:   No problem-specific Assessment & Plan notes found for this encounter.     Dr. Elberta Fortis, DO [facility]    {    This will disappear when note is signed, click to select method of visit    :1}

## 2023-04-10 NOTE — ED Triage Notes (Signed)
Pt c/o left sided facial swelling. Pt concerned for an abscess in her mouth. Pt reports it throbbing last night.

## 2023-04-10 NOTE — Discharge Instructions (Signed)
Return for any problem.  ?

## 2023-04-12 ENCOUNTER — Encounter: Payer: Self-pay | Admitting: Student

## 2023-06-13 ENCOUNTER — Encounter: Payer: Self-pay | Admitting: Family Medicine

## 2023-06-13 ENCOUNTER — Ambulatory Visit: Payer: Medicaid Other | Admitting: Family Medicine

## 2023-06-13 ENCOUNTER — Other Ambulatory Visit: Payer: Self-pay

## 2023-06-13 VITALS — BP 103/73 | HR 74 | Ht 65.0 in | Wt 174.4 lb

## 2023-06-13 DIAGNOSIS — B353 Tinea pedis: Secondary | ICD-10-CM | POA: Diagnosis present

## 2023-06-13 MED ORDER — TERBINAFINE HCL 1 % EX CREA
1.0000 | TOPICAL_CREAM | Freq: Two times a day (BID) | CUTANEOUS | 0 refills | Status: DC
  Filled 2023-06-13: qty 42, 21d supply, fill #0

## 2023-06-13 NOTE — Patient Instructions (Signed)
I am sending in a different cream for you to try this time for either 2 weeks or until it has resolved. You will use this cream twice daily. If not improving, we may need to consider trying the oral medication instead.

## 2023-06-13 NOTE — Progress Notes (Signed)
    SUBJECTIVE:   CHIEF COMPLAINT / HPI:   Pain in feet - Started a few days ago - Describes it as a burning pain on the inside of her toes with skin peeling too - Has had similar presentation before and treated as athlete's foot with improvement - Used neosporin which helped a little bit - Had gotten new shoes and socks since the last time she had the infection  PERTINENT  PMH / PSH: Reviewed   OBJECTIVE:   BP 103/73   Pulse 74   Ht 5\' 5"  (1.651 m)   Wt 174 lb 6.4 oz (79.1 kg)   LMP 06/09/2023 (Approximate)   SpO2 100%   BMI 29.02 kg/m   General: NAD, well-appearing, well-nourished Respiratory: No respiratory distress, breathing comfortably, able to speak in full sentences Skin: macerated skin with skin breakdown present between the right large and second toe, also present between the left pinky and 4th toe as pictured below.        ASSESSMENT/PLAN:   Tinea pedis of both feet Patient with recurrence of what appears to be tinea pedis.  Patient has had multiple episodes in the last year despite getting new shoes and socks and trying to keep the feet clean and dry.  Previously treated with clotrimazole 1% cream with improvement, but given the recurrence we will do a trial of terbinafine instead.  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 - Terbinafine 1% cream twice daily x 2 weeks - Follow-up if no improvement, consider oral terbinafine - Consider A1c screening if it recurs      Kathleen Leyden, DO [facility]

## 2023-06-13 NOTE — Assessment & Plan Note (Signed)
Patient with recurrence of what appears to be tinea pedis.  Patient has had multiple episodes in the last year despite getting new shoes and socks and trying to keep the feet clean and dry.  Previously treated with clotrimazole 1% cream with improvement, but given the recurrence we will do a trial of terbinafine instead.  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 - Terbinafine 1% cream twice daily x 2 weeks - Follow-up if no improvement, consider oral terbinafine - Consider A1c screening if it recurs

## 2023-12-07 ENCOUNTER — Other Ambulatory Visit: Payer: Self-pay

## 2024-07-02 ENCOUNTER — Ambulatory Visit: Admitting: Family Medicine

## 2024-07-02 ENCOUNTER — Other Ambulatory Visit (HOSPITAL_COMMUNITY)
Admission: RE | Admit: 2024-07-02 | Discharge: 2024-07-02 | Disposition: A | Discharge status: Home / Self Care | Source: Ambulatory Visit | Attending: Family Medicine | Admitting: Family Medicine

## 2024-07-02 ENCOUNTER — Encounter: Payer: Self-pay | Admitting: Family Medicine

## 2024-07-02 VITALS — BP 103/70 | HR 91 | Ht 65.0 in | Wt 182.2 lb

## 2024-07-02 DIAGNOSIS — Z124 Encounter for screening for malignant neoplasm of cervix: Secondary | ICD-10-CM | POA: Diagnosis present

## 2024-07-02 DIAGNOSIS — S90851S Superficial foreign body, right foot, sequela: Secondary | ICD-10-CM | POA: Diagnosis not present

## 2024-07-02 DIAGNOSIS — R062 Wheezing: Secondary | ICD-10-CM

## 2024-07-02 DIAGNOSIS — Z0000 Encounter for general adult medical examination without abnormal findings: Secondary | ICD-10-CM | POA: Diagnosis present

## 2024-07-02 DIAGNOSIS — Z13228 Encounter for screening for other metabolic disorders: Secondary | ICD-10-CM | POA: Diagnosis not present

## 2024-07-02 DIAGNOSIS — Z833 Family history of diabetes mellitus: Secondary | ICD-10-CM

## 2024-07-02 LAB — POCT GLYCOSYLATED HEMOGLOBIN (HGB A1C): Hemoglobin A1C: 4.9 % (ref 4.0–5.6)

## 2024-07-02 MED ORDER — ALBUTEROL SULFATE HFA 108 (90 BASE) MCG/ACT IN AERS: 2.0000 | INHALATION_SPRAY | RESPIRATORY_TRACT | 3 refills | Status: AC | PRN

## 2024-07-02 NOTE — Patient Instructions (Signed)
 I placed a referral to podiatry.  You should receive a call within the next few weeks  I will call you if any of the results from your testing today is abnormal.  Otherwise I will send you a message on MyChart

## 2024-07-02 NOTE — Progress Notes (Addendum)
    SUBJECTIVE:   Chief compliant/HPI: annual examination  Kathleen Alexander is a 28 y.o. who presents today for an annual exam.   Current concerns: Occasionally has some wheezing at night, requesting a refill on her albuterol  inhaler.  Denies waking up short of breath in the middle of the night, snoring, feeling tired throughout the day.  This usually occurs after she smokes weed  Reports having foreign body (glass) stuck in R foot for several years.  Sounds like she stepped on some glass a while ago and it is not now starting to cause some pain and she would like to have it removed if possible.  States that when it initially occurred she went and saw someone who was unable to remove it.  Denies fevers, bleeding, drainage  Due for pap LMP a few weeks ago, 6/16 Denies any vaginal discharge or odor or itching or pain Would also like to be swabbed for STDs but does not desire HIV RPR testing today Declines contraception  Reports family history of diabetes, interested in checking A1c   OBJECTIVE:   BP 103/70   Pulse 91   Ht 5' 5 (1.651 m)   Wt 182 lb 3.2 oz (82.6 kg)   LMP 06/10/2024   SpO2 100%   BMI 30.32 kg/m   General: NAD, pleasant, able to participate in exam Cardiac: RRR, no murmurs auscultated Respiratory: CTAB, normal WOB Abdomen: soft, non-tender, non-distended, normoactive bowel sounds Extremities: Right foot: Palpable mobile mass in distal sole of foot with some overlying scarring.  Somewhat tender.  No overlying erythema  Pelvic exam: normal external genitalia, vulva, vagina, cervix, uterus and adnexa Chaperoned by Thersia CMA.  ASSESSMENT/PLAN:   Assessment & Plan Annual physical exam Screening for cervical cancer Family history of diabetes mellitus BP well-controlled Checking A1c given family history of diabetes (4.9 today, reassuring) Pap/HPV, STD testing today Wheezing Seems to be related to smoking weed, discussed avoiding this Sent refill of  albuterol  Discussed that we can consider additional workup if her symptoms worsen or become more regular.  At the time they seem to be intermittent and directly associated with her smoking Foreign body in right foot, sequela Has been present for several years and likely has scar tissue surrounding it.  I hesitate to try to remove it myself given this and also because it may be sharp and could present a bleeding risk Referral to podiatry for further evaluation and to consider removal of the piece of glass  MyChart Activation:Already signed up   Follow up in 1   year or sooner if indicated.    Payton Coward, MD [facility]

## 2024-07-08 ENCOUNTER — Telehealth: Payer: Self-pay | Admitting: *Deleted

## 2024-07-08 ENCOUNTER — Ambulatory Visit: Payer: Self-pay | Admitting: Family Medicine

## 2024-07-08 DIAGNOSIS — A599 Trichomoniasis, unspecified: Secondary | ICD-10-CM

## 2024-07-08 LAB — CYTOLOGY - PAP
Chlamydia: NEGATIVE
Comment: NEGATIVE
Comment: NEGATIVE
Comment: NEGATIVE
Comment: NORMAL
Diagnosis: UNDETERMINED — AB
High risk HPV: POSITIVE — AB
Neisseria Gonorrhea: NEGATIVE
Trichomonas: POSITIVE — AB

## 2024-07-08 MED ORDER — METRONIDAZOLE 500 MG PO TABS: 500.0000 mg | ORAL_TABLET | Freq: Two times a day (BID) | ORAL | 0 refills | Status: AC

## 2024-07-08 NOTE — Telephone Encounter (Signed)
 See other phone note

## 2024-07-08 NOTE — Addendum Note (Signed)
 Addended byBETHA COWARD, Ladon Vandenberghe on: 07/08/2024 01:56 PM   Modules accepted: Orders

## 2024-07-08 NOTE — Telephone Encounter (Signed)
 Pt calling because she sees that her results show HPV and Trich.  She would like the provider to call her and discuss. Harlene Carte, CMA

## 2024-07-11 ENCOUNTER — Ambulatory Visit (INDEPENDENT_AMBULATORY_CARE_PROVIDER_SITE_OTHER): Admitting: Family Medicine

## 2024-07-11 VITALS — BP 117/79 | HR 85 | Ht 65.5 in

## 2024-07-11 DIAGNOSIS — R87619 Unspecified abnormal cytological findings in specimens from cervix uteri: Secondary | ICD-10-CM | POA: Diagnosis present

## 2024-07-11 DIAGNOSIS — R87618 Other abnormal cytological findings on specimens from cervix uteri: Secondary | ICD-10-CM

## 2024-07-11 LAB — POCT URINE PREGNANCY: Preg Test, Ur: NEGATIVE

## 2024-07-11 NOTE — Progress Notes (Signed)
    CHIEF COMPLAINT / HPI:  F/u abnormal pap   Most Recent  Cytology - PAP Rpt ! (C) 07/02/24 13:37  Material Submitted CervicoVaginal Pap [ThinPrep Imaged] ! 03/15/17 00:00  Adequacy Satisfactory for evaluation; transformation zone component PRESENT. 07/02/24 13:37  Diagnosis: - Atypical squamous cells of undetermined significance (ASC-US ) ! 07/02/24 13:37   PERTINENT  PMH / PSH: I have reviewed the patient's medications, allergies, past medical and surgical history, smoking status and updated in the EMR as appropriate.  03/15/17 00:00 02/01/21 10:48 07/02/24 13:37  Cytology - PAP Rpt ! (C) Rpt (C) Rpt ! (C)  Material Submitted CervicoVaginal Pap [ThinPrep Imaged] !    Adequacy Satisfactory for evaluation  endocervical/transformation zone component PRESENT. ! Satisfactory for evaluation; transformation zone component PRESENT. Satisfactory for evaluation; transformation zone component PRESENT.  Diagnosis: LOW GRADE SQUAMOUS INTRAEPITHELIAL LESION: CIN-1/ HPV (LSIL). ! - Negative for intraepithelial lesion or malignancy (NILM) - Atypical squamous cells of undetermined significance (ASC-US ) !    OBJECTIVE:  BP 117/79   Pulse 85   Ht 5' 5.5 (1.664 m)   LMP 06/10/2024   SpO2 99%   BMI 29.86 kg/m  Patient given informed consent, signed copy in the chart.  Placed in lithotomy position. Cervix viewed with speculum and colposcope after application of acetic acid.   Colposcopy adequate (entire squamocolumnar junctions seen  in entirety) ?  Entire squamocolumnar junction seen with ease Acetowhite lesions?  None Punctation?  None Mosaicism?  None Abnormal vasculature?  None Biopsies?  None ECC?  No Complications?  No complications  COMMENTS: Patient was given post procedure instructions.    ASSESSMENT / PLAN:   Abnormal Papanicolaou smear of cervix with positive human papilloma virus (HPV) test Colposcopy was clinically normal today with no abnormality seen.  Discussed at length with  the patient.  She was very concerned that she might have cervical cancer.  She was relieved after our discussion and aware that she needs to set follow-up in 1 year for Pap smear.  Given her age of 78 I would do that Pap smear with automatic cotesting.   Camie Mulch MD

## 2024-07-11 NOTE — Patient Instructions (Signed)
 As you know your Pap smear showed some mild abnormalities and that is why we did your colposcopic examination today.  The examination of your cervix under the microscope showed no abnormalities that were concerning.  We did not do any biopsies.  There are no restrictions on activities.  There is no pathology to follow-up on.  You should have a regular Pap smear in 1 year.  Please schedule that with your regular provider in 1 year.

## 2024-07-11 NOTE — Assessment & Plan Note (Signed)
 Colposcopy was clinically normal today with no abnormality seen.  Discussed at length with the patient.  She was very concerned that she might have cervical cancer.  She was relieved after our discussion and aware that she needs to set follow-up in 1 year for Pap smear.  Given her age of 50 I would do that Pap smear with automatic cotesting.

## 2024-07-22 ENCOUNTER — Ambulatory Visit (INDEPENDENT_AMBULATORY_CARE_PROVIDER_SITE_OTHER): Admitting: Podiatry

## 2024-07-22 ENCOUNTER — Ambulatory Visit (INDEPENDENT_AMBULATORY_CARE_PROVIDER_SITE_OTHER)

## 2024-07-22 ENCOUNTER — Encounter: Payer: Self-pay | Admitting: Podiatry

## 2024-07-22 DIAGNOSIS — M2041 Other hammer toe(s) (acquired), right foot: Secondary | ICD-10-CM

## 2024-07-22 DIAGNOSIS — L923 Foreign body granuloma of the skin and subcutaneous tissue: Secondary | ICD-10-CM

## 2024-07-22 NOTE — Progress Notes (Signed)
 Patient presents presents with complaint of focal area on the plantar aspect of the right foot she stepped on some glass about 8 years ago and has been problems with it ever since.  Particular pinpoint area where it is extremely painful with any pressure goes on it.  Has not noticed any redness or swelling or drainage from the area.   Physical exam:  General appearance: Pleasant, and in no acute distress. AOx3.  Vascular: Pedal pulses: DP 2/4 bilaterally, PT 2/4 bilaterally.  Mild edema lower legs bilaterally. Capillary fill time immediate bilaterally.  Neurological: Light touch intact feet bilaterally.  Normal Achilles reflex bilaterally.  No clonus or spasticity noted.   Dermatologic:   Area of scarring on the plantar forefoot on the right.  There is a small focal area with exquisite tenderness with slight pressure on it.  Possible small foreign body in the deeper tissues.  Skin normal temperature bilaterally.  Skin normal color, tone, and texture bilaterally.   Musculoskeletal: Mild hammertoe 2 through 5 bilaterally .    Radiographs: Radiographs 3 views right foot: Mild hammertoe deformities 2 through 5 right.  No assenting foreign bodies underlying the forefoot.  Normal joints at lesser MTPs normal bone density.  No bone tumors noted.  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 - Most likely she has a small sliver of retained glass at the area of pinpoint tenderness where the glass is sitting up against the nerve ending.  Easiest way to alleviate this would be to do a simple punch biopsy over the area for removal of foreign body.  This will typically provide good relief.  If it continues to bother her she may need a full  wedge shaped excision of skin to alleviate.  Reduce hyperkeratotic tissue around the area for comfort today   Return 1 week for removal foreign body

## 2024-08-01 ENCOUNTER — Ambulatory Visit: Admitting: Podiatry

## 2025-01-24 ENCOUNTER — Ambulatory Visit: Payer: Self-pay

## 2025-01-24 DIAGNOSIS — B353 Tinea pedis: Secondary | ICD-10-CM | POA: Diagnosis present

## 2025-01-24 DIAGNOSIS — L242 Irritant contact dermatitis due to solvents: Secondary | ICD-10-CM

## 2025-01-24 MED ORDER — TERBINAFINE HCL 1 % EX CREA: 1.0000 | TOPICAL_CREAM | Freq: Two times a day (BID) | CUTANEOUS | 0 refills | Status: DC

## 2025-01-24 MED ORDER — TERBINAFINE HCL 1 % EX CREA: 1.0000 | TOPICAL_CREAM | Freq: Two times a day (BID) | CUTANEOUS | 3 refills | Status: AC

## 2025-01-24 NOTE — Assessment & Plan Note (Signed)
 Poorly controlled, chronic ongoing issue, has tried multiple different treatments though unclear whether full adherence to treatment regimens.  Foot fungus has improved considerably over the last several months though now presenting with sharp burning pain of fissures. Terbinafine  cream twice daily for 4 weeks Vaseline to interdigit fissures and to apply gauze to wounds as needed for relief of pain between terbinafine  treatments Encouraged well ventilated shoes and sock wearing

## 2025-01-24 NOTE — Progress Notes (Signed)
" ° ° °  SUBJECTIVE:   CHIEF COMPLAINT / HPI:   Burning feet: Has had a history of tinea pedis. Has been applying anti-fungal creams which has not helped burning pain.  Was previously prescribed terbinafine  which she used for a time, then clotrimazole  which she also used for a time, has not been on either of those and has been using over-the-counter antifungal cream recently. Has also applied neosporin which helps the burning a little. Has a history of this burning pain on feet for over a year now. Denies any rash or numbness with the pain. No history of ankle or lumbar trauma. Broke R toe 3-4 years ago. Denies fevers or chills. Has applied vaseline to overnight which helps a little.  Arm rash: First noticed two weeks ago, was also itchy of R flexural surface, first noticed some bumps, has put coco butter and alcohol and peroxide on the rash. Has used scented perfumes and lotions applied to this area as well as other unaffected areas on the body. Has not had this kind of rash in the past. Has noticed some bumps on her chest after applying perfumes. No reported history of allergies.  No Fhx of psoriasis, xerosis, eczema.   PERTINENT  PMH / PSH: Tinea pedis  OBJECTIVE:   There were no vitals taken for this visit.   Cardiac: Regular rate and rhythm.  Skin well-perfused of bilateral distal extremities  Lungs: No work of breathing, in no acute distress.  Right arm: Mild hyperpigmentation and patch of rough skin of medial flexural surface, please see attached photo. Feet: Mild maceration of interdigit spaces of bilateral 3rd and 4th digits, fissure appreciated of plantar surface of right fifth digit.         ASSESSMENT/PLAN:   Assessment & Plan Tinea pedis of both feet Poorly controlled, chronic ongoing issue, has tried multiple different treatments though unclear whether full adherence to treatment regimens.  Foot fungus has improved considerably over the last several months though now  presenting with sharp burning pain of fissures. Terbinafine  cream twice daily for 4 weeks Vaseline to interdigit fissures and to apply gauze to wounds as needed for relief of pain between terbinafine  treatments Encouraged well ventilated shoes and sock wearing Irritant contact dermatitis due to solvent Appears to be contact dermatitis due to alcohol/hydrogen peroxide rubs given history and physical exam, very mild presentation.  Less likely to be atopic dermatitis given onset of presentation and history. Counseled regarding cessation of frequent alcohol and hydrogen peroxide to skin Apply Vaseline twice daily especially after showers     Kathleen Amy, MD [facility] "
